# Patient Record
Sex: Male | Born: 1940 | Race: Black or African American | Marital: Married
Health system: Southern US, Community
[De-identification: ages and names within clinical notes are randomized; demographics above are authoritative.]

---

## 2009-11-30 ENCOUNTER — Inpatient Hospital Stay: Payer: Self-pay | Admitting: Internal Medicine

## 2012-01-14 ENCOUNTER — Inpatient Hospital Stay: Payer: Self-pay | Admitting: Internal Medicine

## 2012-01-14 ENCOUNTER — Ambulatory Visit: Payer: Self-pay | Admitting: Urology

## 2012-01-14 LAB — CBC WITH DIFFERENTIAL/PLATELET
Basophil %: 0.2 %
Basophil %: 0.4 %
Eosinophil #: 0 10*3/uL (ref 0.0–0.7)
Eosinophil %: 1 %
HCT: 22.9 % — ABNORMAL LOW (ref 40.0–52.0)
HGB: 7.8 g/dL — ABNORMAL LOW (ref 13.0–18.0)
HGB: 8.4 g/dL — ABNORMAL LOW (ref 13.0–18.0)
Lymphocyte #: 0.6 10*3/uL — ABNORMAL LOW (ref 1.0–3.6)
Lymphocyte %: 11.9 %
MCH: 31.7 pg (ref 26.0–34.0)
MCH: 31.7 pg (ref 26.0–34.0)
MCHC: 34.1 g/dL (ref 32.0–36.0)
MCHC: 34.1 g/dL (ref 32.0–36.0)
MCV: 93 fL (ref 80–100)
MCV: 93 fL (ref 80–100)
Monocyte #: 0.6 x10 3/mm (ref 0.2–1.0)
Monocyte %: 11.3 %
Neutrophil #: 4.2 10*3/uL (ref 1.4–6.5)
Neutrophil #: 4.6 10*3/uL (ref 1.4–6.5)
Neutrophil %: 75.4 %
Platelet: 204 10*3/uL (ref 150–440)
WBC: 5.8 10*3/uL (ref 3.8–10.6)

## 2012-01-14 LAB — URINALYSIS, COMPLETE
Ketone: NEGATIVE
Leukocyte Esterase: NEGATIVE
Nitrite: NEGATIVE
Ph: 5 (ref 4.5–8.0)
Protein: 100
Squamous Epithelial: NONE SEEN
WBC UR: NONE SEEN /HPF (ref 0–5)

## 2012-01-14 LAB — IRON AND TIBC
Iron Bind.Cap.(Total): 289 ug/dL (ref 250–450)
Iron: 60 ug/dL — ABNORMAL LOW (ref 65–175)

## 2012-01-14 LAB — COMPREHENSIVE METABOLIC PANEL
Albumin: 3.2 g/dL — ABNORMAL LOW (ref 3.4–5.0)
Anion Gap: 12 (ref 7–16)
BUN: 113 mg/dL — ABNORMAL HIGH (ref 7–18)
Bilirubin,Total: 0.3 mg/dL (ref 0.2–1.0)
Chloride: 113 mmol/L — ABNORMAL HIGH (ref 98–107)
Co2: 14 mmol/L — ABNORMAL LOW (ref 21–32)
Creatinine: 10.49 mg/dL — ABNORMAL HIGH (ref 0.60–1.30)
EGFR (African American): 5 — ABNORMAL LOW
EGFR (Non-African Amer.): 4 — ABNORMAL LOW
Osmolality: 312 (ref 275–301)
Potassium: 6.3 mmol/L — ABNORMAL HIGH (ref 3.5–5.1)
SGOT(AST): 18 U/L (ref 15–37)
Total Protein: 6.7 g/dL (ref 6.4–8.2)

## 2012-01-14 LAB — BASIC METABOLIC PANEL
Anion Gap: 13 (ref 7–16)
Anion Gap: 14 (ref 7–16)
BUN: 116 mg/dL — ABNORMAL HIGH (ref 7–18)
BUN: 91 mg/dL — ABNORMAL HIGH (ref 7–18)
Calcium, Total: 8.5 mg/dL (ref 8.5–10.1)
Chloride: 110 mmol/L — ABNORMAL HIGH (ref 98–107)
Chloride: 116 mmol/L — ABNORMAL HIGH (ref 98–107)
Co2: 18 mmol/L — ABNORMAL LOW (ref 21–32)
EGFR (African American): 6 — ABNORMAL LOW
EGFR (Non-African Amer.): 4 — ABNORMAL LOW
EGFR (Non-African Amer.): 5 — ABNORMAL LOW
Glucose: 121 mg/dL — ABNORMAL HIGH (ref 65–99)
Glucose: 62 mg/dL — ABNORMAL LOW (ref 65–99)
Osmolality: 312 (ref 275–301)
Potassium: 4.4 mmol/L (ref 3.5–5.1)
Sodium: 142 mmol/L (ref 136–145)
Sodium: 142 mmol/L (ref 136–145)

## 2012-01-14 LAB — PROTIME-INR: INR: 1.2

## 2012-01-14 LAB — SODIUM, URINE, RANDOM: Sodium, Urine Random: 55 mmol/L (ref 20–110)

## 2012-01-14 LAB — PRO B NATRIURETIC PEPTIDE: B-Type Natriuretic Peptide: 1498 pg/mL — ABNORMAL HIGH (ref 0–125)

## 2012-01-14 LAB — FERRITIN: Ferritin (ARMC): 289 ng/mL (ref 8–388)

## 2012-01-14 LAB — PHOSPHORUS: Phosphorus: 4.4 mg/dL (ref 2.5–4.9)

## 2012-01-14 LAB — RETICULOCYTES
Absolute Retic Count: 0.0223 10*6/uL — ABNORMAL LOW (ref 0.031–0.129)
Reticulocyte: 0.85 % (ref 0.7–2.5)

## 2012-01-14 LAB — MAGNESIUM: Magnesium: 2.6 mg/dL — ABNORMAL HIGH

## 2012-01-15 LAB — CBC WITH DIFFERENTIAL/PLATELET
Basophil #: 0 10*3/uL (ref 0.0–0.1)
Basophil %: 0.2 %
Eosinophil #: 0.1 10*3/uL (ref 0.0–0.7)
HCT: 20.8 % — ABNORMAL LOW (ref 40.0–52.0)
HGB: 7.3 g/dL — ABNORMAL LOW (ref 13.0–18.0)
Lymphocyte #: 0.4 10*3/uL — ABNORMAL LOW (ref 1.0–3.6)
Lymphocyte %: 5.7 %
MCH: 32.4 pg (ref 26.0–34.0)
MCHC: 35 g/dL (ref 32.0–36.0)
MCV: 93 fL (ref 80–100)
Monocyte %: 13.2 %
Neutrophil #: 5.4 10*3/uL (ref 1.4–6.5)
Platelet: 161 10*3/uL (ref 150–440)
RDW: 12.8 % (ref 11.5–14.5)

## 2012-01-15 LAB — BASIC METABOLIC PANEL
Anion Gap: 11 (ref 7–16)
Calcium, Total: 7.6 mg/dL — ABNORMAL LOW (ref 8.5–10.1)
Chloride: 108 mmol/L — ABNORMAL HIGH (ref 98–107)
Creatinine: 10.3 mg/dL — ABNORMAL HIGH (ref 0.60–1.30)
EGFR (African American): 5 — ABNORMAL LOW
EGFR (Non-African Amer.): 4 — ABNORMAL LOW
Glucose: 142 mg/dL — ABNORMAL HIGH (ref 65–99)
Osmolality: 308 (ref 275–301)
Potassium: 4.1 mmol/L (ref 3.5–5.1)
Sodium: 139 mmol/L (ref 136–145)

## 2012-01-15 LAB — URINE CULTURE

## 2012-01-15 LAB — PHOSPHORUS: Phosphorus: 7.4 mg/dL — ABNORMAL HIGH (ref 2.5–4.9)

## 2012-01-16 LAB — BASIC METABOLIC PANEL
Anion Gap: 12 (ref 7–16)
Calcium, Total: 7.4 mg/dL — ABNORMAL LOW (ref 8.5–10.1)
Chloride: 102 mmol/L (ref 98–107)
Co2: 23 mmol/L (ref 21–32)
Creatinine: 9.58 mg/dL — ABNORMAL HIGH (ref 0.60–1.30)
EGFR (African American): 6 — ABNORMAL LOW
Potassium: 3.9 mmol/L (ref 3.5–5.1)
Sodium: 137 mmol/L (ref 136–145)

## 2012-01-16 LAB — CBC WITH DIFFERENTIAL/PLATELET
Basophil %: 0.3 %
Eosinophil #: 0 10*3/uL (ref 0.0–0.7)
Eosinophil %: 0.8 %
HCT: 21.1 % — ABNORMAL LOW (ref 40.0–52.0)
HGB: 7.4 g/dL — ABNORMAL LOW (ref 13.0–18.0)
Lymphocyte #: 0.8 10*3/uL — ABNORMAL LOW (ref 1.0–3.6)
Lymphocyte %: 13 %
MCH: 32.3 pg (ref 26.0–34.0)
MCHC: 35.1 g/dL (ref 32.0–36.0)
MCV: 92 fL (ref 80–100)
Monocyte #: 0.9 x10 3/mm (ref 0.2–1.0)
Neutrophil #: 4.1 10*3/uL (ref 1.4–6.5)
RBC: 2.3 10*6/uL — ABNORMAL LOW (ref 4.40–5.90)

## 2012-01-16 LAB — PHOSPHORUS: Phosphorus: 6.9 mg/dL — ABNORMAL HIGH (ref 2.5–4.9)

## 2012-01-17 LAB — PSA: PSA: 1.7 ng/mL (ref 0.0–4.0)

## 2012-01-17 LAB — APTT: Activated PTT: 33.6 secs (ref 23.6–35.9)

## 2012-01-18 LAB — CBC WITH DIFFERENTIAL/PLATELET
Basophil #: 0 10*3/uL (ref 0.0–0.1)
Basophil %: 0.3 %
Eosinophil #: 0 10*3/uL (ref 0.0–0.7)
Eosinophil %: 0.4 %
Lymphocyte #: 0.6 10*3/uL — ABNORMAL LOW (ref 1.0–3.6)
MCH: 32 pg (ref 26.0–34.0)
MCHC: 34.7 g/dL (ref 32.0–36.0)
Monocyte #: 0.9 x10 3/mm (ref 0.2–1.0)
Neutrophil #: 5.8 10*3/uL (ref 1.4–6.5)
RDW: 12.3 % (ref 11.5–14.5)

## 2012-01-18 LAB — BASIC METABOLIC PANEL
BUN: 41 mg/dL — ABNORMAL HIGH (ref 7–18)
Calcium, Total: 7.8 mg/dL — ABNORMAL LOW (ref 8.5–10.1)
Chloride: 106 mmol/L (ref 98–107)
Creatinine: 3.62 mg/dL — ABNORMAL HIGH (ref 0.60–1.30)
EGFR (Non-African Amer.): 16 — ABNORMAL LOW
Osmolality: 295 (ref 275–301)
Potassium: 3.7 mmol/L (ref 3.5–5.1)
Sodium: 142 mmol/L (ref 136–145)

## 2012-01-19 LAB — CBC WITH DIFFERENTIAL/PLATELET
Basophil #: 0 10*3/uL (ref 0.0–0.1)
Basophil %: 0.5 %
Eosinophil #: 0.1 10*3/uL (ref 0.0–0.7)
HCT: 20.4 % — ABNORMAL LOW (ref 40.0–52.0)
HGB: 6.9 g/dL — ABNORMAL LOW (ref 13.0–18.0)
Lymphocyte %: 14.4 %
MCHC: 34 g/dL (ref 32.0–36.0)
Monocyte #: 0.7 x10 3/mm (ref 0.2–1.0)
Neutrophil #: 3.8 10*3/uL (ref 1.4–6.5)
Neutrophil %: 70.5 %
Platelet: 169 10*3/uL (ref 150–440)
RBC: 2.19 10*6/uL — ABNORMAL LOW (ref 4.40–5.90)
RDW: 12.3 % (ref 11.5–14.5)
WBC: 5.4 10*3/uL (ref 3.8–10.6)

## 2012-01-19 LAB — BASIC METABOLIC PANEL
Anion Gap: 9 (ref 7–16)
BUN: 33 mg/dL — ABNORMAL HIGH (ref 7–18)
Calcium, Total: 7.8 mg/dL — ABNORMAL LOW (ref 8.5–10.1)
Chloride: 108 mmol/L — ABNORMAL HIGH (ref 98–107)
Co2: 28 mmol/L (ref 21–32)
EGFR (Non-African Amer.): 25 — ABNORMAL LOW
Osmolality: 298 (ref 275–301)
Potassium: 3.6 mmol/L (ref 3.5–5.1)

## 2012-01-20 ENCOUNTER — Ambulatory Visit: Payer: Self-pay | Admitting: Internal Medicine

## 2012-01-20 LAB — CBC WITH DIFFERENTIAL/PLATELET
Eosinophil %: 2.2 %
MCH: 32.3 pg (ref 26.0–34.0)
MCHC: 34.4 g/dL (ref 32.0–36.0)
MCV: 94 fL (ref 80–100)
Monocyte #: 0.7 x10 3/mm (ref 0.2–1.0)
Monocyte %: 10.1 %
Neutrophil %: 76.6 %
Platelet: 185 10*3/uL (ref 150–440)
RBC: 2.18 10*6/uL — ABNORMAL LOW (ref 4.40–5.90)
RDW: 12.3 % (ref 11.5–14.5)

## 2012-01-20 LAB — BASIC METABOLIC PANEL WITH GFR
Anion Gap: 7
BUN: 27 mg/dL — ABNORMAL HIGH
Calcium, Total: 7.7 mg/dL — ABNORMAL LOW
Chloride: 112 mmol/L — ABNORMAL HIGH
Co2: 27 mmol/L
Creatinine: 2.24 mg/dL — ABNORMAL HIGH
EGFR (African American): 33 — ABNORMAL LOW
EGFR (Non-African Amer.): 28 — ABNORMAL LOW
Glucose: 126 mg/dL — ABNORMAL HIGH
Osmolality: 297
Potassium: 3.9 mmol/L
Sodium: 146 mmol/L — ABNORMAL HIGH

## 2012-01-21 LAB — BASIC METABOLIC PANEL
BUN: 28 mg/dL — ABNORMAL HIGH (ref 7–18)
Calcium, Total: 7.8 mg/dL — ABNORMAL LOW (ref 8.5–10.1)
EGFR (African American): 37 — ABNORMAL LOW
EGFR (Non-African Amer.): 32 — ABNORMAL LOW
Glucose: 146 mg/dL — ABNORMAL HIGH (ref 65–99)
Osmolality: 295 (ref 275–301)
Potassium: 3.7 mmol/L (ref 3.5–5.1)
Sodium: 144 mmol/L (ref 136–145)

## 2012-01-22 LAB — CULTURE, BLOOD (SINGLE)

## 2012-01-23 ENCOUNTER — Ambulatory Visit: Payer: Self-pay | Admitting: Internal Medicine

## 2012-01-23 LAB — CBC WITH DIFFERENTIAL/PLATELET
Basophil %: 0.6 %
Eosinophil %: 2.3 %
Lymphocyte #: 0.9 10*3/uL — ABNORMAL LOW (ref 1.0–3.6)
Monocyte #: 0.5 x10 3/mm (ref 0.2–1.0)
Monocyte %: 9.7 %
Neutrophil %: 71.2 %
Platelet: 245 10*3/uL (ref 150–440)
RBC: 2.23 10*6/uL — ABNORMAL LOW (ref 4.40–5.90)
WBC: 5.5 10*3/uL (ref 3.8–10.6)

## 2012-01-25 LAB — PATHOLOGY REPORT

## 2012-02-02 ENCOUNTER — Ambulatory Visit: Payer: Self-pay | Admitting: Internal Medicine

## 2012-02-06 LAB — CBC CANCER CENTER
Basophil #: 0 x10 3/mm (ref 0.0–0.1)
Eosinophil #: 0.1 x10 3/mm (ref 0.0–0.7)
HCT: 24.8 % — ABNORMAL LOW (ref 40.0–52.0)
Lymphocyte #: 0.9 x10 3/mm — ABNORMAL LOW (ref 1.0–3.6)
MCH: 30.6 pg (ref 26.0–34.0)
MCHC: 32.5 g/dL (ref 32.0–36.0)
MCV: 94 fL (ref 80–100)
Monocyte #: 0.4 x10 3/mm (ref 0.2–1.0)
Monocyte %: 7.9 %
Neutrophil #: 3.9 x10 3/mm (ref 1.4–6.5)
Platelet: 248 x10 3/mm (ref 150–440)
RDW: 13.2 % (ref 11.5–14.5)

## 2012-02-06 LAB — BASIC METABOLIC PANEL
BUN: 45 mg/dL — ABNORMAL HIGH (ref 7–18)
Chloride: 103 mmol/L (ref 98–107)
Co2: 21 mmol/L (ref 21–32)
Creatinine: 2.01 mg/dL — ABNORMAL HIGH (ref 0.60–1.30)
EGFR (Non-African Amer.): 32 — ABNORMAL LOW
Osmolality: 289 (ref 275–301)

## 2012-02-13 LAB — BASIC METABOLIC PANEL
Anion Gap: 11 (ref 7–16)
BUN: 51 mg/dL — ABNORMAL HIGH (ref 7–18)
Chloride: 104 mmol/L (ref 98–107)
Co2: 22 mmol/L (ref 21–32)
EGFR (African American): 32 — ABNORMAL LOW
Glucose: 154 mg/dL — ABNORMAL HIGH (ref 65–99)
Osmolality: 291 (ref 275–301)
Sodium: 137 mmol/L (ref 136–145)

## 2012-02-13 LAB — CBC CANCER CENTER
Basophil #: 0 x10 3/mm (ref 0.0–0.1)
HGB: 7.1 g/dL — ABNORMAL LOW (ref 13.0–18.0)
Lymphocyte #: 0.6 x10 3/mm — ABNORMAL LOW (ref 1.0–3.6)
MCV: 94 fL (ref 80–100)
Monocyte #: 0.1 x10 3/mm — ABNORMAL LOW (ref 0.2–1.0)
Monocyte %: 5.5 %
Platelet: 123 x10 3/mm — ABNORMAL LOW (ref 150–440)
RDW: 12.8 % (ref 11.5–14.5)

## 2012-02-20 LAB — CBC CANCER CENTER
Basophil #: 0 x10 3/mm (ref 0.0–0.1)
Basophil %: 0.5 %
Eosinophil #: 0 x10 3/mm (ref 0.0–0.7)
HCT: 23.1 % — ABNORMAL LOW (ref 40.0–52.0)
Lymphocyte %: 44.2 %
MCHC: 33.1 g/dL (ref 32.0–36.0)
Monocyte %: 3.4 %
Neutrophil %: 51.3 %
RBC: 2.49 10*6/uL — ABNORMAL LOW (ref 4.40–5.90)
RDW: 12.3 % (ref 11.5–14.5)
WBC: 0.9 x10 3/mm — CL (ref 3.8–10.6)

## 2012-02-20 LAB — CREATININE, SERUM: EGFR (African American): 35 — ABNORMAL LOW

## 2012-02-21 ENCOUNTER — Inpatient Hospital Stay: Payer: Self-pay | Admitting: Student

## 2012-02-21 LAB — URINALYSIS, COMPLETE
Bilirubin,UR: NEGATIVE
Glucose,UR: NEGATIVE mg/dL (ref 0–75)
Glucose,UR: NEGATIVE mg/dL (ref 0–75)
Ketone: NEGATIVE
Nitrite: NEGATIVE
Nitrite: NEGATIVE
Ph: 5 (ref 4.5–8.0)
Ph: 5 (ref 4.5–8.0)
Protein: 100
RBC,UR: 1782 /HPF (ref 0–5)
RBC,UR: 60 /HPF (ref 0–5)
Specific Gravity: 1.013 (ref 1.003–1.030)
Squamous Epithelial: NONE SEEN
Squamous Epithelial: NONE SEEN
WBC UR: 13 /HPF (ref 0–5)

## 2012-02-21 LAB — CBC WITH DIFFERENTIAL/PLATELET
Basophil #: 0 10*3/uL (ref 0.0–0.1)
Eosinophil #: 0 10*3/uL (ref 0.0–0.7)
HGB: 7.1 g/dL — ABNORMAL LOW (ref 13.0–18.0)
Lymphocyte %: 46.3 %
MCHC: 35.1 g/dL (ref 32.0–36.0)
Neutrophil %: 47.5 %
Platelet: 30 10*3/uL — CL (ref 150–440)
RDW: 12.6 % (ref 11.5–14.5)

## 2012-02-21 LAB — COMPREHENSIVE METABOLIC PANEL
Albumin: 2.9 g/dL — ABNORMAL LOW (ref 3.4–5.0)
Alkaline Phosphatase: 66 U/L (ref 50–136)
Anion Gap: 8 (ref 7–16)
BUN: 47 mg/dL — ABNORMAL HIGH (ref 7–18)
Calcium, Total: 8.2 mg/dL — ABNORMAL LOW (ref 8.5–10.1)
EGFR (African American): 36 — ABNORMAL LOW
Glucose: 124 mg/dL — ABNORMAL HIGH (ref 65–99)
Potassium: 4.7 mmol/L (ref 3.5–5.1)
SGOT(AST): 13 U/L — ABNORMAL LOW (ref 15–37)
Sodium: 138 mmol/L (ref 136–145)

## 2012-02-22 LAB — CBC WITH DIFFERENTIAL/PLATELET
Basophil %: 0.5 %
Eosinophil #: 0 10*3/uL (ref 0.0–0.7)
Eosinophil %: 1 %
HCT: 18.5 % — ABNORMAL LOW (ref 40.0–52.0)
HGB: 6.3 g/dL — ABNORMAL LOW (ref 13.0–18.0)
Lymphocyte #: 0.5 10*3/uL — ABNORMAL LOW (ref 1.0–3.6)
Lymphocyte %: 25.9 %
MCHC: 34.3 g/dL (ref 32.0–36.0)
Monocyte %: 5.6 %
Neutrophil #: 1.2 10*3/uL — ABNORMAL LOW (ref 1.4–6.5)
Neutrophil %: 67 %
RBC: 2.04 10*6/uL — ABNORMAL LOW (ref 4.40–5.90)

## 2012-02-22 LAB — CREATININE, SERUM
Creatinine: 1.87 mg/dL — ABNORMAL HIGH (ref 0.60–1.30)
EGFR (African American): 40 — ABNORMAL LOW
EGFR (Non-African Amer.): 35 — ABNORMAL LOW

## 2012-02-23 LAB — CREATININE, SERUM
Creatinine: 1.93 mg/dL — ABNORMAL HIGH (ref 0.60–1.30)
EGFR (African American): 39 — ABNORMAL LOW

## 2012-02-23 LAB — CULTURE, BLOOD (SINGLE)

## 2012-02-25 LAB — CBC WITH DIFFERENTIAL/PLATELET
Bands: 6 %
Basophil #: 0 10*3/uL (ref 0.0–0.1)
Comment - H1-Com1: NORMAL
Eosinophil #: 0.1 10*3/uL (ref 0.0–0.7)
HCT: 24.3 % — ABNORMAL LOW (ref 40.0–52.0)
HGB: 8.3 g/dL — ABNORMAL LOW (ref 13.0–18.0)
HGB: 7.6 g/dL — ABNORMAL LOW (ref 13.0–18.0)
Lymphocyte #: 0.6 10*3/uL — ABNORMAL LOW (ref 1.0–3.6)
MCH: 31.1 pg (ref 26.0–34.0)
MCH: 31.4 pg (ref 26.0–34.0)
MCHC: 34.9 g/dL (ref 32.0–36.0)
MCV: 91 fL (ref 80–100)
Monocyte #: 0.3 x10 3/mm (ref 0.2–1.0)
Myelocyte: 3 %
Neutrophil #: 1.2 10*3/uL — ABNORMAL LOW (ref 1.4–6.5)
Platelet: 146 10*3/uL — ABNORMAL LOW (ref 150–440)
Promyelocyte: 1 %
RBC: 2.42 10*6/uL — ABNORMAL LOW (ref 4.40–5.90)
RDW: 12.3 % (ref 11.5–14.5)
RDW: 12.5 % (ref 11.5–14.5)
Segmented Neutrophils: 37 %
WBC: 2.2 10*3/uL — ABNORMAL LOW (ref 3.8–10.6)
WBC: 9.8 10*3/uL (ref 3.8–10.6)

## 2012-02-25 LAB — CREATININE, SERUM
Creatinine: 1.88 mg/dL — ABNORMAL HIGH (ref 0.60–1.30)
EGFR (African American): 40 — ABNORMAL LOW

## 2012-02-27 LAB — CBC CANCER CENTER
Basophil #: 0.3 x10 3/mm — ABNORMAL HIGH (ref 0.0–0.1)
Eosinophil #: 0 x10 3/mm (ref 0.0–0.7)
Eosinophil %: 0.2 %
HGB: 7.9 g/dL — ABNORMAL LOW (ref 13.0–18.0)
Lymphocyte #: 1.4 x10 3/mm (ref 1.0–3.6)
Lymphocyte %: 5.1 %
Monocyte %: 4.1 %
Platelet: 258 x10 3/mm (ref 150–440)
RDW: 12.6 % (ref 11.5–14.5)

## 2012-02-27 LAB — BASIC METABOLIC PANEL
BUN: 34 mg/dL — ABNORMAL HIGH (ref 7–18)
Calcium, Total: 8.4 mg/dL — ABNORMAL LOW (ref 8.5–10.1)
Chloride: 105 mmol/L (ref 98–107)
Osmolality: 293 (ref 275–301)
Potassium: 5 mmol/L (ref 3.5–5.1)
Sodium: 141 mmol/L (ref 136–145)

## 2012-02-27 LAB — HEPATIC FUNCTION PANEL A (ARMC)
Alkaline Phosphatase: 95 U/L (ref 50–136)
Bilirubin, Direct: 0.1 mg/dL (ref 0.00–0.20)
Bilirubin,Total: 0.1 mg/dL — ABNORMAL LOW (ref 0.2–1.0)
SGPT (ALT): 36 U/L (ref 12–78)
Total Protein: 6.7 g/dL (ref 6.4–8.2)

## 2012-02-28 ENCOUNTER — Emergency Department: Payer: Self-pay

## 2012-02-29 LAB — CULTURE, BLOOD (SINGLE)

## 2012-03-03 ENCOUNTER — Ambulatory Visit: Payer: Self-pay | Admitting: Internal Medicine

## 2012-03-05 LAB — BASIC METABOLIC PANEL
Calcium, Total: 8.4 mg/dL — ABNORMAL LOW (ref 8.5–10.1)
Chloride: 108 mmol/L — ABNORMAL HIGH (ref 98–107)
Co2: 24 mmol/L (ref 21–32)
Creatinine: 1.97 mg/dL — ABNORMAL HIGH (ref 0.60–1.30)
EGFR (African American): 38 — ABNORMAL LOW
Osmolality: 297 (ref 275–301)
Sodium: 143 mmol/L (ref 136–145)

## 2012-03-05 LAB — CBC CANCER CENTER
Eosinophil %: 0.5 %
HCT: 23 % — ABNORMAL LOW (ref 40.0–52.0)
Lymphocyte #: 1 x10 3/mm (ref 1.0–3.6)
MCH: 31.2 pg (ref 26.0–34.0)
MCV: 91 fL (ref 80–100)
Monocyte #: 0.7 x10 3/mm (ref 0.2–1.0)
Neutrophil #: 5.6 x10 3/mm (ref 1.4–6.5)
Neutrophil %: 75.7 %
Platelet: 203 x10 3/mm (ref 150–440)
RBC: 2.54 10*6/uL — ABNORMAL LOW (ref 4.40–5.90)
RDW: 12.9 % (ref 11.5–14.5)

## 2012-03-08 ENCOUNTER — Ambulatory Visit: Payer: Self-pay | Admitting: Urology

## 2012-03-12 LAB — CBC CANCER CENTER
Basophil #: 0.1 x10 3/mm (ref 0.0–0.1)
Basophil %: 0.6 %
Eosinophil #: 0 x10 3/mm (ref 0.0–0.7)
Eosinophil %: 0 %
HGB: 7.6 g/dL — ABNORMAL LOW (ref 13.0–18.0)
Lymphocyte #: 1.2 x10 3/mm (ref 1.0–3.6)
MCH: 31.6 pg (ref 26.0–34.0)
MCHC: 34.6 g/dL (ref 32.0–36.0)
MCV: 91 fL (ref 80–100)
Monocyte #: 0.4 x10 3/mm (ref 0.2–1.0)
Neutrophil %: 88.5 %
RBC: 2.41 10*6/uL — ABNORMAL LOW (ref 4.40–5.90)
RDW: 13.6 % (ref 11.5–14.5)

## 2012-03-12 LAB — BASIC METABOLIC PANEL
Calcium, Total: 8.3 mg/dL — ABNORMAL LOW (ref 8.5–10.1)
Chloride: 107 mmol/L (ref 98–107)
Co2: 24 mmol/L (ref 21–32)
EGFR (Non-African Amer.): 30 — ABNORMAL LOW
Glucose: 105 mg/dL — ABNORMAL HIGH (ref 65–99)
Potassium: 4.8 mmol/L (ref 3.5–5.1)
Sodium: 141 mmol/L (ref 136–145)

## 2012-03-19 LAB — CBC CANCER CENTER
Basophil %: 0.4 %
Eosinophil %: 0.4 %
HGB: 6.8 g/dL — ABNORMAL LOW (ref 13.0–18.0)
Lymphocyte #: 1 x10 3/mm (ref 1.0–3.6)
MCH: 31.3 pg (ref 26.0–34.0)
MCV: 91 fL (ref 80–100)
Monocyte #: 0.3 x10 3/mm (ref 0.2–1.0)
Monocyte %: 3.8 %
Neutrophil %: 81.5 %
RBC: 2.16 10*6/uL — ABNORMAL LOW (ref 4.40–5.90)
WBC: 7.5 x10 3/mm (ref 3.8–10.6)

## 2012-03-19 LAB — BASIC METABOLIC PANEL
Anion Gap: 12 (ref 7–16)
Calcium, Total: 8.3 mg/dL — ABNORMAL LOW (ref 8.5–10.1)
Co2: 22 mmol/L (ref 21–32)
Creatinine: 2.32 mg/dL — ABNORMAL HIGH (ref 0.60–1.30)
EGFR (Non-African Amer.): 27 — ABNORMAL LOW
Glucose: 181 mg/dL — ABNORMAL HIGH (ref 65–99)
Potassium: 4.8 mmol/L (ref 3.5–5.1)
Sodium: 142 mmol/L (ref 136–145)

## 2012-04-02 LAB — CBC CANCER CENTER
Basophil #: 0 x10 3/mm (ref 0.0–0.1)
Eosinophil #: 0.1 x10 3/mm (ref 0.0–0.7)
Eosinophil %: 2.4 %
Lymphocyte #: 0.9 x10 3/mm — ABNORMAL LOW (ref 1.0–3.6)
MCH: 31.2 pg (ref 26.0–34.0)
MCHC: 33.5 g/dL (ref 32.0–36.0)
MCV: 93 fL (ref 80–100)
Monocyte #: 0.6 x10 3/mm (ref 0.2–1.0)
Monocyte %: 12.3 %
Neutrophil %: 66.8 %
Platelet: 433 x10 3/mm (ref 150–440)
RBC: 2.42 10*6/uL — ABNORMAL LOW (ref 4.40–5.90)
RDW: 16.4 % — ABNORMAL HIGH (ref 11.5–14.5)
WBC: 5.2 x10 3/mm (ref 3.8–10.6)

## 2012-04-02 LAB — BASIC METABOLIC PANEL
BUN: 41 mg/dL — ABNORMAL HIGH (ref 7–18)
Calcium, Total: 8.3 mg/dL — ABNORMAL LOW (ref 8.5–10.1)
Co2: 22 mmol/L (ref 21–32)
EGFR (African American): 31 — ABNORMAL LOW
EGFR (Non-African Amer.): 26 — ABNORMAL LOW
Glucose: 138 mg/dL — ABNORMAL HIGH (ref 65–99)
Osmolality: 290 (ref 275–301)
Sodium: 139 mmol/L (ref 136–145)

## 2012-04-03 ENCOUNTER — Ambulatory Visit: Payer: Self-pay | Admitting: Internal Medicine

## 2012-04-04 LAB — BASIC METABOLIC PANEL
Anion Gap: 10 (ref 7–16)
Calcium, Total: 8.2 mg/dL — ABNORMAL LOW (ref 8.5–10.1)
EGFR (African American): 32 — ABNORMAL LOW
EGFR (Non-African Amer.): 27 — ABNORMAL LOW
Potassium: 5.1 mmol/L (ref 3.5–5.1)
Sodium: 137 mmol/L (ref 136–145)

## 2012-04-09 LAB — CBC CANCER CENTER
Basophil #: 0 x10 3/mm (ref 0.0–0.1)
Basophil %: 0.6 %
Eosinophil #: 0 x10 3/mm (ref 0.0–0.7)
Eosinophil %: 0.3 %
HCT: 23.1 % — ABNORMAL LOW (ref 40.0–52.0)
Lymphocyte #: 0.9 x10 3/mm — ABNORMAL LOW (ref 1.0–3.6)
Lymphocyte %: 12.1 %
MCH: 31.8 pg (ref 26.0–34.0)
Neutrophil #: 6.1 x10 3/mm (ref 1.4–6.5)
Neutrophil %: 78.4 %
Platelet: 196 x10 3/mm (ref 150–440)
WBC: 7.8 x10 3/mm (ref 3.8–10.6)

## 2012-04-09 LAB — BASIC METABOLIC PANEL
BUN: 39 mg/dL — ABNORMAL HIGH (ref 7–18)
Calcium, Total: 8.3 mg/dL — ABNORMAL LOW (ref 8.5–10.1)
Chloride: 105 mmol/L (ref 98–107)
Creatinine: 2.02 mg/dL — ABNORMAL HIGH (ref 0.60–1.30)
EGFR (African American): 37 — ABNORMAL LOW
EGFR (Non-African Amer.): 32 — ABNORMAL LOW
Glucose: 173 mg/dL — ABNORMAL HIGH (ref 65–99)
Osmolality: 289 (ref 275–301)
Potassium: 5 mmol/L (ref 3.5–5.1)
Sodium: 138 mmol/L (ref 136–145)

## 2012-04-16 LAB — CBC CANCER CENTER
Basophil #: 0.1 x10 3/mm (ref 0.0–0.1)
Basophil %: 0.3 %
MCV: 94 fL (ref 80–100)
Monocyte #: 1.9 x10 3/mm — ABNORMAL HIGH (ref 0.2–1.0)
Monocyte %: 5.7 %
Neutrophil #: 30.2 x10 3/mm — ABNORMAL HIGH (ref 1.4–6.5)
Neutrophil %: 90.1 %
RBC: 2.1 10*6/uL — ABNORMAL LOW (ref 4.40–5.90)
RDW: 16.2 % — ABNORMAL HIGH (ref 11.5–14.5)

## 2012-04-16 LAB — IRON AND TIBC
Iron Saturation: 76 %
Iron: 193 ug/dL — ABNORMAL HIGH (ref 65–175)

## 2012-04-16 LAB — HEPATIC FUNCTION PANEL A (ARMC)
Alkaline Phosphatase: 153 U/L — ABNORMAL HIGH (ref 50–136)
SGOT(AST): 17 U/L (ref 15–37)

## 2012-04-16 LAB — BASIC METABOLIC PANEL
Anion Gap: 8 (ref 7–16)
Calcium, Total: 8.4 mg/dL — ABNORMAL LOW (ref 8.5–10.1)
Chloride: 107 mmol/L (ref 98–107)
Co2: 24 mmol/L (ref 21–32)
Creatinine: 2.24 mg/dL — ABNORMAL HIGH (ref 0.60–1.30)
EGFR (African American): 32 — ABNORMAL LOW
EGFR (Non-African Amer.): 28 — ABNORMAL LOW
Glucose: 119 mg/dL — ABNORMAL HIGH (ref 65–99)
Osmolality: 292 (ref 275–301)

## 2012-04-16 LAB — FERRITIN: Ferritin (ARMC): 1172 ng/mL — ABNORMAL HIGH (ref 8–388)

## 2012-04-16 LAB — FOLATE: Folic Acid: 31 ng/mL (ref 3.1–100.0)

## 2012-04-23 LAB — CBC CANCER CENTER
Basophil #: 0 x10 3/mm (ref 0.0–0.1)
Basophil %: 0.2 %
Eosinophil #: 0.1 x10 3/mm (ref 0.0–0.7)
HCT: 23.5 % — ABNORMAL LOW (ref 40.0–52.0)
HGB: 8.1 g/dL — ABNORMAL LOW (ref 13.0–18.0)
MCH: 31 pg (ref 26.0–34.0)
MCHC: 34.3 g/dL (ref 32.0–36.0)
MCV: 90 fL (ref 80–100)
Monocyte #: 1.4 x10 3/mm — ABNORMAL HIGH (ref 0.2–1.0)
Neutrophil #: 21.9 x10 3/mm — ABNORMAL HIGH (ref 1.4–6.5)
Platelet: 203 x10 3/mm (ref 150–440)
RBC: 2.6 10*6/uL — ABNORMAL LOW (ref 4.40–5.90)
RDW: 16.8 % — ABNORMAL HIGH (ref 11.5–14.5)
WBC: 24.7 x10 3/mm — ABNORMAL HIGH (ref 3.8–10.6)

## 2012-05-04 ENCOUNTER — Ambulatory Visit: Payer: Self-pay | Admitting: Internal Medicine

## 2012-05-07 LAB — CBC CANCER CENTER
Basophil #: 0 x10 3/mm (ref 0.0–0.1)
Basophil %: 0.2 %
Eosinophil #: 0 x10 3/mm (ref 0.0–0.7)
Eosinophil %: 0.3 %
HCT: 24 % — ABNORMAL LOW (ref 40.0–52.0)
HGB: 8.3 g/dL — ABNORMAL LOW (ref 13.0–18.0)
Lymphocyte %: 8.1 %
MCV: 93 fL (ref 80–100)
Monocyte #: 1 x10 3/mm (ref 0.2–1.0)
Monocyte %: 10.5 %
Neutrophil %: 80.9 %
Platelet: 179 x10 3/mm (ref 150–440)
RDW: 19.6 % — ABNORMAL HIGH (ref 11.5–14.5)

## 2012-05-20 ENCOUNTER — Ambulatory Visit: Payer: Self-pay | Admitting: Urology

## 2012-05-21 LAB — BASIC METABOLIC PANEL
Anion Gap: 8 (ref 7–16)
BUN: 56 mg/dL — ABNORMAL HIGH (ref 7–18)
Calcium, Total: 8.4 mg/dL — ABNORMAL LOW (ref 8.5–10.1)
Co2: 25 mmol/L (ref 21–32)
EGFR (African American): 29 — ABNORMAL LOW
Glucose: 159 mg/dL — ABNORMAL HIGH (ref 65–99)
Sodium: 138 mmol/L (ref 136–145)

## 2012-05-21 LAB — CBC CANCER CENTER
Basophil %: 0.7 %
HCT: 23.5 % — ABNORMAL LOW (ref 40.0–52.0)
Lymphocyte %: 16.6 %
MCHC: 34.1 g/dL (ref 32.0–36.0)
MCV: 94 fL (ref 80–100)
Monocyte #: 0.4 x10 3/mm (ref 0.2–1.0)
Neutrophil #: 4 x10 3/mm (ref 1.4–6.5)
Neutrophil %: 72.7 %
Platelet: 208 x10 3/mm (ref 150–440)
RBC: 2.51 10*6/uL — ABNORMAL LOW (ref 4.40–5.90)

## 2012-05-23 LAB — POTASSIUM: Potassium: 4.6 mmol/L (ref 3.5–5.1)

## 2012-05-27 ENCOUNTER — Ambulatory Visit: Payer: Self-pay | Admitting: Urology

## 2012-05-29 LAB — BASIC METABOLIC PANEL
Anion Gap: 8 (ref 7–16)
BUN: 45 mg/dL — ABNORMAL HIGH (ref 7–18)
Chloride: 106 mmol/L (ref 98–107)
EGFR (African American): 29 — ABNORMAL LOW
EGFR (Non-African Amer.): 25 — ABNORMAL LOW
Glucose: 138 mg/dL — ABNORMAL HIGH (ref 65–99)
Sodium: 139 mmol/L (ref 136–145)

## 2012-05-29 LAB — CBC CANCER CENTER
Eosinophil %: 1.8 %
HCT: 27.4 % — ABNORMAL LOW (ref 40.0–52.0)
HGB: 9.3 g/dL — ABNORMAL LOW (ref 13.0–18.0)
Lymphocyte #: 0.7 x10 3/mm — ABNORMAL LOW (ref 1.0–3.6)
Lymphocyte %: 12.7 %
MCHC: 33.9 g/dL (ref 32.0–36.0)
Monocyte #: 0.4 x10 3/mm (ref 0.2–1.0)
Monocyte %: 6.5 %
Neutrophil #: 4.5 x10 3/mm (ref 1.4–6.5)
Neutrophil %: 78.5 %
Platelet: 199 x10 3/mm (ref 150–440)
RDW: 19.6 % — ABNORMAL HIGH (ref 11.5–14.5)

## 2012-06-01 ENCOUNTER — Ambulatory Visit: Payer: Self-pay | Admitting: Internal Medicine

## 2012-06-05 LAB — BASIC METABOLIC PANEL
Anion Gap: 10 (ref 7–16)
Calcium, Total: 8.1 mg/dL — ABNORMAL LOW (ref 8.5–10.1)
Chloride: 106 mmol/L (ref 98–107)
Co2: 23 mmol/L (ref 21–32)
Creatinine: 2.56 mg/dL — ABNORMAL HIGH (ref 0.60–1.30)
EGFR (African American): 28 — ABNORMAL LOW
EGFR (Non-African Amer.): 24 — ABNORMAL LOW
Glucose: 116 mg/dL — ABNORMAL HIGH (ref 65–99)
Osmolality: 293 (ref 275–301)
Potassium: 5.1 mmol/L (ref 3.5–5.1)
Sodium: 139 mmol/L (ref 136–145)

## 2012-06-05 LAB — CBC CANCER CENTER
Basophil #: 0 x10 3/mm (ref 0.0–0.1)
Basophil %: 0.6 %
Eosinophil %: 2.1 %
HCT: 28 % — ABNORMAL LOW (ref 40.0–52.0)
HGB: 9.4 g/dL — ABNORMAL LOW (ref 13.0–18.0)
Lymphocyte %: 15.1 %
MCH: 32.5 pg (ref 26.0–34.0)
MCV: 96 fL (ref 80–100)
Monocyte #: 0.4 x10 3/mm (ref 0.2–1.0)
Neutrophil #: 3.8 x10 3/mm (ref 1.4–6.5)
Neutrophil %: 75 %
Platelet: 157 x10 3/mm (ref 150–440)
RBC: 2.9 10*6/uL — ABNORMAL LOW (ref 4.40–5.90)

## 2012-06-11 ENCOUNTER — Ambulatory Visit: Payer: Self-pay | Admitting: Urology

## 2012-06-12 LAB — CBC CANCER CENTER
Basophil #: 0 x10 3/mm (ref 0.0–0.1)
Eosinophil #: 0.1 x10 3/mm (ref 0.0–0.7)
HCT: 26.3 % — ABNORMAL LOW (ref 40.0–52.0)
HGB: 8.7 g/dL — ABNORMAL LOW (ref 13.0–18.0)
Lymphocyte #: 0.5 x10 3/mm — ABNORMAL LOW (ref 1.0–3.6)
Lymphocyte %: 9.5 %
MCH: 32.3 pg (ref 26.0–34.0)
MCV: 98 fL (ref 80–100)
Monocyte #: 0.3 x10 3/mm (ref 0.2–1.0)
Neutrophil #: 4.7 x10 3/mm (ref 1.4–6.5)
Neutrophil %: 83.4 %
RBC: 2.7 10*6/uL — ABNORMAL LOW (ref 4.40–5.90)
RDW: 16 % — ABNORMAL HIGH (ref 11.5–14.5)
WBC: 5.6 x10 3/mm (ref 3.8–10.6)

## 2012-06-12 LAB — HEPATIC FUNCTION PANEL A (ARMC)
Albumin: 3.1 g/dL — ABNORMAL LOW (ref 3.4–5.0)
Total Protein: 6.9 g/dL (ref 6.4–8.2)

## 2012-06-12 LAB — BASIC METABOLIC PANEL
Anion Gap: 10 (ref 7–16)
BUN: 54 mg/dL — ABNORMAL HIGH (ref 7–18)
Calcium, Total: 8 mg/dL — ABNORMAL LOW (ref 8.5–10.1)
Co2: 24 mmol/L (ref 21–32)
Creatinine: 2.64 mg/dL — ABNORMAL HIGH (ref 0.60–1.30)
Glucose: 183 mg/dL — ABNORMAL HIGH (ref 65–99)
Osmolality: 301 (ref 275–301)
Potassium: 5 mmol/L (ref 3.5–5.1)
Sodium: 141 mmol/L (ref 136–145)

## 2012-06-19 LAB — CBC CANCER CENTER
Basophil %: 0.3 %
Eosinophil #: 0.2 x10 3/mm (ref 0.0–0.7)
Eosinophil %: 5.2 %
HGB: 8.8 g/dL — ABNORMAL LOW (ref 13.0–18.0)
Lymphocyte #: 0.7 x10 3/mm — ABNORMAL LOW (ref 1.0–3.6)
Lymphocyte %: 16.6 %
MCH: 32.9 pg (ref 26.0–34.0)
MCV: 96 fL (ref 80–100)
Monocyte %: 8.2 %
Neutrophil #: 2.8 x10 3/mm (ref 1.4–6.5)
RBC: 2.68 10*6/uL — ABNORMAL LOW (ref 4.40–5.90)
RDW: 15.4 % — ABNORMAL HIGH (ref 11.5–14.5)

## 2012-06-19 LAB — CREATININE, SERUM
Creatinine: 2.41 mg/dL — ABNORMAL HIGH (ref 0.60–1.30)
EGFR (African American): 30 — ABNORMAL LOW
EGFR (Non-African Amer.): 26 — ABNORMAL LOW

## 2012-06-26 LAB — CBC CANCER CENTER
Basophil #: 0 x10 3/mm (ref 0.0–0.1)
Eosinophil %: 4.7 %
HCT: 24.7 % — ABNORMAL LOW (ref 40.0–52.0)
HGB: 8.6 g/dL — ABNORMAL LOW (ref 13.0–18.0)
Lymphocyte #: 0.4 x10 3/mm — ABNORMAL LOW (ref 1.0–3.6)
Lymphocyte %: 12.3 %
MCHC: 34.8 g/dL (ref 32.0–36.0)
MCV: 96 fL (ref 80–100)
Monocyte %: 11.6 %
Neutrophil #: 2.5 x10 3/mm (ref 1.4–6.5)
Neutrophil %: 70.9 %
Platelet: 113 x10 3/mm — ABNORMAL LOW (ref 150–440)
RBC: 2.58 10*6/uL — ABNORMAL LOW (ref 4.40–5.90)
RDW: 14.7 % — ABNORMAL HIGH (ref 11.5–14.5)

## 2012-06-26 LAB — HEPATIC FUNCTION PANEL A (ARMC): SGOT(AST): 17 U/L (ref 15–37)

## 2012-06-26 LAB — BASIC METABOLIC PANEL
Anion Gap: 8 (ref 7–16)
BUN: 51 mg/dL — ABNORMAL HIGH (ref 7–18)
Chloride: 106 mmol/L (ref 98–107)
Co2: 25 mmol/L (ref 21–32)
EGFR (African American): 26 — ABNORMAL LOW
Glucose: 121 mg/dL — ABNORMAL HIGH (ref 65–99)
Osmolality: 292 (ref 275–301)
Sodium: 139 mmol/L (ref 136–145)

## 2012-07-02 ENCOUNTER — Ambulatory Visit: Payer: Self-pay | Admitting: Internal Medicine

## 2012-07-03 LAB — CBC CANCER CENTER
Basophil %: 0.5 %
Eosinophil %: 4.1 %
HCT: 24 % — ABNORMAL LOW (ref 40.0–52.0)
Lymphocyte %: 9.2 %
MCHC: 33.6 g/dL (ref 32.0–36.0)
Monocyte #: 0.4 x10 3/mm (ref 0.2–1.0)
Neutrophil %: 75.6 %
RBC: 2.51 10*6/uL — ABNORMAL LOW (ref 4.40–5.90)
RDW: 14.8 % — ABNORMAL HIGH (ref 11.5–14.5)
WBC: 4 x10 3/mm (ref 3.8–10.6)

## 2012-07-03 LAB — CREATININE, SERUM
Creatinine: 2.65 mg/dL — ABNORMAL HIGH (ref 0.60–1.30)
EGFR (African American): 27 — ABNORMAL LOW
EGFR (Non-African Amer.): 23 — ABNORMAL LOW

## 2012-07-10 ENCOUNTER — Ambulatory Visit: Payer: Self-pay | Admitting: Urology

## 2012-07-10 LAB — PROTIME-INR: Prothrombin Time: 14.5 secs (ref 11.5–14.7)

## 2012-07-17 LAB — CBC CANCER CENTER
Basophil %: 0.2 %
Eosinophil #: 0.1 x10 3/mm (ref 0.0–0.7)
Eosinophil %: 4.4 %
HGB: 8 g/dL — ABNORMAL LOW (ref 13.0–18.0)
Lymphocyte %: 12.6 %
Monocyte #: 0.4 x10 3/mm (ref 0.2–1.0)
Monocyte %: 13.9 %
Neutrophil #: 2 x10 3/mm (ref 1.4–6.5)
Platelet: 133 x10 3/mm — ABNORMAL LOW (ref 150–440)
RBC: 2.42 10*6/uL — ABNORMAL LOW (ref 4.40–5.90)

## 2012-07-17 LAB — BASIC METABOLIC PANEL
Anion Gap: 10 (ref 7–16)
BUN: 50 mg/dL — ABNORMAL HIGH (ref 7–18)
Chloride: 106 mmol/L (ref 98–107)
Co2: 22 mmol/L (ref 21–32)
Creatinine: 2.7 mg/dL — ABNORMAL HIGH (ref 0.60–1.30)
EGFR (Non-African Amer.): 22 — ABNORMAL LOW
Glucose: 104 mg/dL — ABNORMAL HIGH (ref 65–99)
Osmolality: 289 (ref 275–301)
Potassium: 4.6 mmol/L (ref 3.5–5.1)
Sodium: 138 mmol/L (ref 136–145)

## 2012-07-17 LAB — HEPATIC FUNCTION PANEL A (ARMC)
Albumin: 3.1 g/dL — ABNORMAL LOW (ref 3.4–5.0)
Alkaline Phosphatase: 80 U/L (ref 50–136)
Bilirubin,Total: 0.2 mg/dL (ref 0.2–1.0)
SGOT(AST): 22 U/L (ref 15–37)
Total Protein: 7.2 g/dL (ref 6.4–8.2)

## 2012-07-24 LAB — CBC CANCER CENTER
Eosinophil %: 2.5 %
HCT: 22 % — ABNORMAL LOW (ref 40.0–52.0)
Lymphocyte #: 0.4 x10 3/mm — ABNORMAL LOW (ref 1.0–3.6)
MCH: 32.8 pg (ref 26.0–34.0)
Monocyte #: 0.4 x10 3/mm (ref 0.2–1.0)
Monocyte %: 13.4 %
Neutrophil #: 1.9 x10 3/mm (ref 1.4–6.5)
Neutrophil %: 68.2 %
Platelet: 132 x10 3/mm — ABNORMAL LOW (ref 150–440)
RBC: 2.27 10*6/uL — ABNORMAL LOW (ref 4.40–5.90)
RDW: 15 % — ABNORMAL HIGH (ref 11.5–14.5)

## 2012-07-24 LAB — BASIC METABOLIC PANEL
BUN: 57 mg/dL — ABNORMAL HIGH (ref 7–18)
Calcium, Total: 8.3 mg/dL — ABNORMAL LOW (ref 8.5–10.1)
Chloride: 110 mmol/L — ABNORMAL HIGH (ref 98–107)
Creatinine: 2.93 mg/dL — ABNORMAL HIGH (ref 0.60–1.30)
EGFR (African American): 23 — ABNORMAL LOW
Potassium: 5 mmol/L (ref 3.5–5.1)

## 2012-07-31 LAB — CBC CANCER CENTER
Basophil #: 0 x10 3/mm (ref 0.0–0.1)
Basophil %: 0.5 %
Eosinophil #: 0.1 x10 3/mm (ref 0.0–0.7)
HGB: 7.9 g/dL — ABNORMAL LOW (ref 13.0–18.0)
Lymphocyte #: 0.4 x10 3/mm — ABNORMAL LOW (ref 1.0–3.6)
Lymphocyte %: 10.7 %
MCH: 33 pg (ref 26.0–34.0)
MCHC: 34.2 g/dL (ref 32.0–36.0)
MCV: 97 fL (ref 80–100)
Monocyte #: 0.3 x10 3/mm (ref 0.2–1.0)
Monocyte %: 9.6 %
RBC: 2.39 10*6/uL — ABNORMAL LOW (ref 4.40–5.90)
RDW: 16.1 % — ABNORMAL HIGH (ref 11.5–14.5)
WBC: 3.5 x10 3/mm — ABNORMAL LOW (ref 3.8–10.6)

## 2012-07-31 LAB — BASIC METABOLIC PANEL
Anion Gap: 13 (ref 7–16)
Co2: 22 mmol/L (ref 21–32)
Creatinine: 2.8 mg/dL — ABNORMAL HIGH (ref 0.60–1.30)
EGFR (African American): 25 — ABNORMAL LOW
Osmolality: 295 (ref 275–301)

## 2012-08-01 ENCOUNTER — Ambulatory Visit: Payer: Self-pay | Admitting: Internal Medicine

## 2012-08-21 LAB — CBC CANCER CENTER
Basophil #: 0 x10 3/mm (ref 0.0–0.1)
Eosinophil #: 0 x10 3/mm (ref 0.0–0.7)
Eosinophil %: 1 %
HCT: 21.8 % — ABNORMAL LOW (ref 40.0–52.0)
HGB: 7.5 g/dL — ABNORMAL LOW (ref 13.0–18.0)
Lymphocyte #: 0.6 x10 3/mm — ABNORMAL LOW (ref 1.0–3.6)
Lymphocyte %: 17.1 %
MCH: 33.6 pg (ref 26.0–34.0)
MCV: 97 fL (ref 80–100)
Monocyte #: 0.4 x10 3/mm (ref 0.2–1.0)
Monocyte %: 12.2 %
Platelet: 181 x10 3/mm (ref 150–440)
RDW: 16.1 % — ABNORMAL HIGH (ref 11.5–14.5)
WBC: 3.7 x10 3/mm — ABNORMAL LOW (ref 3.8–10.6)

## 2012-08-21 LAB — BASIC METABOLIC PANEL
Anion Gap: 12 (ref 7–16)
BUN: 72 mg/dL — ABNORMAL HIGH (ref 7–18)
Calcium, Total: 8.5 mg/dL (ref 8.5–10.1)
Creatinine: 3.12 mg/dL — ABNORMAL HIGH (ref 0.60–1.30)
EGFR (African American): 22 — ABNORMAL LOW
EGFR (Non-African Amer.): 19 — ABNORMAL LOW
Osmolality: 300 (ref 275–301)
Potassium: 5.7 mmol/L — ABNORMAL HIGH (ref 3.5–5.1)

## 2012-08-22 ENCOUNTER — Ambulatory Visit: Payer: Self-pay | Admitting: Urology

## 2012-08-22 LAB — POTASSIUM: Potassium: 4.6 mmol/L (ref 3.5–5.1)

## 2012-08-23 LAB — BASIC METABOLIC PANEL
Anion Gap: 10 (ref 7–16)
Calcium, Total: 8.2 mg/dL — ABNORMAL LOW (ref 8.5–10.1)
Co2: 23 mmol/L (ref 21–32)
Creatinine: 2.92 mg/dL — ABNORMAL HIGH (ref 0.60–1.30)
EGFR (African American): 24 — ABNORMAL LOW
Osmolality: 302 (ref 275–301)
Potassium: 4.9 mmol/L (ref 3.5–5.1)
Sodium: 142 mmol/L (ref 136–145)

## 2012-09-01 ENCOUNTER — Ambulatory Visit: Payer: Self-pay | Admitting: Internal Medicine

## 2012-09-18 LAB — HEPATIC FUNCTION PANEL A (ARMC)
Albumin: 3.2 g/dL — ABNORMAL LOW (ref 3.4–5.0)
Alkaline Phosphatase: 78 U/L (ref 50–136)
Bilirubin, Direct: 0.1 mg/dL (ref 0.00–0.20)
SGOT(AST): 18 U/L (ref 15–37)
SGPT (ALT): 20 U/L (ref 12–78)
Total Protein: 7.3 g/dL (ref 6.4–8.2)

## 2012-09-18 LAB — BASIC METABOLIC PANEL
BUN: 55 mg/dL — ABNORMAL HIGH (ref 7–18)
Chloride: 107 mmol/L (ref 98–107)
Co2: 21 mmol/L (ref 21–32)
EGFR (African American): 24 — ABNORMAL LOW
EGFR (Non-African Amer.): 20 — ABNORMAL LOW
Glucose: 111 mg/dL — ABNORMAL HIGH (ref 65–99)
Potassium: 4.8 mmol/L (ref 3.5–5.1)

## 2012-09-18 LAB — CBC CANCER CENTER
Basophil %: 0.5 %
HCT: 20.5 % — ABNORMAL LOW (ref 40.0–52.0)
HGB: 7.2 g/dL — ABNORMAL LOW (ref 13.0–18.0)
MCHC: 35.1 g/dL (ref 32.0–36.0)
Monocyte %: 10.7 %
Neutrophil #: 3.3 x10 3/mm (ref 1.4–6.5)
Neutrophil %: 74.7 %
RBC: 2.07 10*6/uL — ABNORMAL LOW (ref 4.40–5.90)
RDW: 14.2 % (ref 11.5–14.5)
WBC: 4.5 x10 3/mm (ref 3.8–10.6)

## 2012-09-20 ENCOUNTER — Ambulatory Visit: Payer: Self-pay | Admitting: Urology

## 2012-09-20 LAB — PROTIME-INR: INR: 1.1

## 2012-09-20 LAB — PLATELET COUNT: Platelet: 218 10*3/uL (ref 150–440)

## 2012-09-20 LAB — APTT: Activated PTT: 31.9 secs (ref 23.6–35.9)

## 2012-10-01 ENCOUNTER — Ambulatory Visit: Payer: Self-pay | Admitting: Internal Medicine

## 2012-10-29 ENCOUNTER — Ambulatory Visit: Payer: Self-pay | Admitting: Urology

## 2012-10-29 DIAGNOSIS — I10 Essential (primary) hypertension: Secondary | ICD-10-CM

## 2012-10-29 LAB — BASIC METABOLIC PANEL
Anion Gap: 8 (ref 7–16)
Chloride: 108 mmol/L — ABNORMAL HIGH (ref 98–107)
Co2: 21 mmol/L (ref 21–32)
Creatinine: 2.57 mg/dL — ABNORMAL HIGH (ref 0.60–1.30)
EGFR (African American): 28 — ABNORMAL LOW
EGFR (Non-African Amer.): 24 — ABNORMAL LOW
Glucose: 140 mg/dL — ABNORMAL HIGH (ref 65–99)
Potassium: 4.7 mmol/L (ref 3.5–5.1)
Sodium: 137 mmol/L (ref 136–145)

## 2012-10-29 LAB — HEMOGLOBIN: HGB: 7.1 g/dL — ABNORMAL LOW (ref 13.0–18.0)

## 2012-11-06 ENCOUNTER — Inpatient Hospital Stay: Payer: Self-pay | Admitting: Urology

## 2012-11-06 LAB — CBC WITH DIFFERENTIAL/PLATELET
Basophil #: 0 10*3/uL (ref 0.0–0.1)
Basophil %: 0.1 %
Eosinophil #: 0 10*3/uL (ref 0.0–0.7)
Lymphocyte #: 0.3 10*3/uL — ABNORMAL LOW (ref 1.0–3.6)
Monocyte %: 11.1 %
Neutrophil #: 5 10*3/uL (ref 1.4–6.5)
Neutrophil %: 83 %
RDW: 13.1 % (ref 11.5–14.5)

## 2012-11-06 LAB — BASIC METABOLIC PANEL
Anion Gap: 5 — ABNORMAL LOW (ref 7–16)
BUN: 44 mg/dL — ABNORMAL HIGH (ref 7–18)
Calcium, Total: 8.1 mg/dL — ABNORMAL LOW (ref 8.5–10.1)
Creatinine: 2.41 mg/dL — ABNORMAL HIGH (ref 0.60–1.30)
Osmolality: 295 (ref 275–301)

## 2012-11-07 LAB — CBC WITH DIFFERENTIAL/PLATELET
Basophil %: 0.4 %
Eosinophil %: 1.4 %
HCT: 27.9 % — ABNORMAL LOW (ref 40.0–52.0)
HGB: 9.7 g/dL — ABNORMAL LOW (ref 13.0–18.0)
Lymphocyte %: 8.2 %
MCHC: 34.7 g/dL (ref 32.0–36.0)
Monocyte #: 0.7 x10 3/mm (ref 0.2–1.0)
Neutrophil #: 4.4 10*3/uL (ref 1.4–6.5)
Platelet: 177 10*3/uL (ref 150–440)
RDW: 13.5 % (ref 11.5–14.5)
WBC: 5.6 10*3/uL (ref 3.8–10.6)

## 2012-11-07 LAB — BASIC METABOLIC PANEL
Calcium, Total: 8.3 mg/dL — ABNORMAL LOW (ref 8.5–10.1)
Chloride: 107 mmol/L (ref 98–107)
Co2: 22 mmol/L (ref 21–32)
Creatinine: 2.22 mg/dL — ABNORMAL HIGH (ref 0.60–1.30)
EGFR (African American): 33 — ABNORMAL LOW
EGFR (Non-African Amer.): 28 — ABNORMAL LOW
Osmolality: 285 (ref 275–301)
Sodium: 136 mmol/L (ref 136–145)

## 2012-11-08 LAB — CBC WITH DIFFERENTIAL/PLATELET
Basophil #: 0 10*3/uL (ref 0.0–0.1)
Basophil %: 0.2 %
Eosinophil #: 0 10*3/uL (ref 0.0–0.7)
Eosinophil %: 0.1 %
HGB: 9.3 g/dL — ABNORMAL LOW (ref 13.0–18.0)
Lymphocyte #: 0.5 10*3/uL — ABNORMAL LOW (ref 1.0–3.6)
Lymphocyte %: 6.7 %
MCH: 33.1 pg (ref 26.0–34.0)
Monocyte %: 10.9 %
Neutrophil #: 6 10*3/uL (ref 1.4–6.5)
Neutrophil %: 82.1 %
Platelet: 173 10*3/uL (ref 150–440)
RBC: 2.81 10*6/uL — ABNORMAL LOW (ref 4.40–5.90)
RDW: 13.7 % (ref 11.5–14.5)

## 2012-11-08 LAB — BASIC METABOLIC PANEL
Anion Gap: 6 — ABNORMAL LOW (ref 7–16)
BUN: 40 mg/dL — ABNORMAL HIGH (ref 7–18)
Calcium, Total: 8.1 mg/dL — ABNORMAL LOW (ref 8.5–10.1)
Chloride: 104 mmol/L (ref 98–107)
Creatinine: 1.95 mg/dL — ABNORMAL HIGH (ref 0.60–1.30)
EGFR (African American): 38 — ABNORMAL LOW
EGFR (Non-African Amer.): 33 — ABNORMAL LOW
Glucose: 189 mg/dL — ABNORMAL HIGH (ref 65–99)
Potassium: 4.5 mmol/L (ref 3.5–5.1)
Sodium: 134 mmol/L — ABNORMAL LOW (ref 136–145)

## 2012-11-18 LAB — CBC
MCH: 32.7 pg (ref 26.0–34.0)
MCV: 94 fL (ref 80–100)
RBC: 2.54 10*6/uL — ABNORMAL LOW (ref 4.40–5.90)
RDW: 13.4 % (ref 11.5–14.5)
WBC: 10.5 10*3/uL (ref 3.8–10.6)

## 2012-11-18 LAB — COMPREHENSIVE METABOLIC PANEL
Albumin: 2.7 g/dL — ABNORMAL LOW (ref 3.4–5.0)
Alkaline Phosphatase: 69 U/L (ref 50–136)
Anion Gap: 7 (ref 7–16)
BUN: 54 mg/dL — ABNORMAL HIGH (ref 7–18)
Chloride: 102 mmol/L (ref 98–107)
EGFR (African American): 27 — ABNORMAL LOW
Glucose: 106 mg/dL — ABNORMAL HIGH (ref 65–99)
SGPT (ALT): 16 U/L (ref 12–78)
Sodium: 132 mmol/L — ABNORMAL LOW (ref 136–145)

## 2012-11-18 LAB — URINALYSIS, COMPLETE
Bilirubin,UR: NEGATIVE
Glucose,UR: NEGATIVE mg/dL (ref 0–75)
Hyaline Cast: 2
Ketone: NEGATIVE
Ph: 5 (ref 4.5–8.0)
Protein: 100
RBC,UR: 32 /HPF (ref 0–5)
Specific Gravity: 1.015 (ref 1.003–1.030)

## 2012-11-19 ENCOUNTER — Inpatient Hospital Stay: Payer: Self-pay | Admitting: Internal Medicine

## 2012-11-20 LAB — BASIC METABOLIC PANEL
Anion Gap: 7 (ref 7–16)
Calcium, Total: 8.3 mg/dL — ABNORMAL LOW (ref 8.5–10.1)
Co2: 23 mmol/L (ref 21–32)
EGFR (Non-African Amer.): 24 — ABNORMAL LOW
Glucose: 101 mg/dL — ABNORMAL HIGH (ref 65–99)
Osmolality: 279 (ref 275–301)
Sodium: 134 mmol/L — ABNORMAL LOW (ref 136–145)

## 2012-11-21 LAB — CULTURE, BLOOD (SINGLE)

## 2012-11-21 LAB — BASIC METABOLIC PANEL
Anion Gap: 5 — ABNORMAL LOW (ref 7–16)
BUN: 42 mg/dL — ABNORMAL HIGH (ref 7–18)
Calcium, Total: 8.5 mg/dL (ref 8.5–10.1)
Creatinine: 2.54 mg/dL — ABNORMAL HIGH (ref 0.60–1.30)
EGFR (African American): 28 — ABNORMAL LOW
EGFR (Non-African Amer.): 24 — ABNORMAL LOW
Potassium: 4 mmol/L (ref 3.5–5.1)

## 2012-11-22 LAB — BASIC METABOLIC PANEL
Co2: 25 mmol/L (ref 21–32)
Creatinine: 2.3 mg/dL — ABNORMAL HIGH (ref 0.60–1.30)
EGFR (African American): 31 — ABNORMAL LOW
Glucose: 83 mg/dL (ref 65–99)
Potassium: 4 mmol/L (ref 3.5–5.1)
Sodium: 135 mmol/L — ABNORMAL LOW (ref 136–145)

## 2012-11-22 LAB — PROTIME-INR: INR: 1.2

## 2012-11-22 LAB — APTT: Activated PTT: 32 secs (ref 23.6–35.9)

## 2012-11-23 LAB — BASIC METABOLIC PANEL
Anion Gap: 6 — ABNORMAL LOW (ref 7–16)
BUN: 39 mg/dL — ABNORMAL HIGH (ref 7–18)
Chloride: 107 mmol/L (ref 98–107)
Co2: 24 mmol/L (ref 21–32)
EGFR (African American): 30 — ABNORMAL LOW
EGFR (Non-African Amer.): 26 — ABNORMAL LOW
Glucose: 208 mg/dL — ABNORMAL HIGH (ref 65–99)
Osmolality: 289 (ref 275–301)
Potassium: 3.9 mmol/L (ref 3.5–5.1)
Sodium: 137 mmol/L (ref 136–145)

## 2012-12-01 ENCOUNTER — Other Ambulatory Visit: Payer: Self-pay

## 2012-12-01 LAB — CBC WITH DIFFERENTIAL/PLATELET
Basophil #: 0 10*3/uL (ref 0.0–0.1)
Basophil %: 0.3 %
HCT: 21 % — ABNORMAL LOW (ref 40.0–52.0)
HGB: 7.3 g/dL — ABNORMAL LOW (ref 13.0–18.0)
Lymphocyte #: 0.5 10*3/uL — ABNORMAL LOW (ref 1.0–3.6)
Lymphocyte %: 6.5 %
MCHC: 35 g/dL (ref 32.0–36.0)
MCV: 93 fL (ref 80–100)
Neutrophil %: 85.1 %
Platelet: 296 10*3/uL (ref 150–440)
RBC: 2.26 10*6/uL — ABNORMAL LOW (ref 4.40–5.90)
WBC: 7.4 10*3/uL (ref 3.8–10.6)

## 2012-12-01 LAB — COMPREHENSIVE METABOLIC PANEL
Albumin: 2.4 g/dL — ABNORMAL LOW (ref 3.4–5.0)
Alkaline Phosphatase: 66 U/L (ref 50–136)
Anion Gap: 8 (ref 7–16)
BUN: 40 mg/dL — ABNORMAL HIGH (ref 7–18)
Bilirubin,Total: 0.2 mg/dL (ref 0.2–1.0)
Calcium, Total: 8.3 mg/dL — ABNORMAL LOW (ref 8.5–10.1)
Co2: 24 mmol/L (ref 21–32)
Creatinine: 2.18 mg/dL — ABNORMAL HIGH (ref 0.60–1.30)
EGFR (African American): 34 — ABNORMAL LOW
EGFR (Non-African Amer.): 29 — ABNORMAL LOW
Glucose: 107 mg/dL — ABNORMAL HIGH (ref 65–99)
Osmolality: 286 (ref 275–301)
Potassium: 4.5 mmol/L (ref 3.5–5.1)
SGOT(AST): 13 U/L — ABNORMAL LOW (ref 15–37)
SGPT (ALT): 12 U/L (ref 12–78)
Sodium: 138 mmol/L (ref 136–145)

## 2012-12-01 LAB — SEDIMENTATION RATE: Erythrocyte Sed Rate: 112 mm/hr — ABNORMAL HIGH (ref 0–20)

## 2012-12-02 ENCOUNTER — Ambulatory Visit: Payer: Self-pay | Admitting: Internal Medicine

## 2012-12-08 ENCOUNTER — Other Ambulatory Visit: Payer: Self-pay

## 2012-12-08 LAB — COMPREHENSIVE METABOLIC PANEL
Albumin: 2.4 g/dL — ABNORMAL LOW (ref 3.4–5.0)
Anion Gap: 6 — ABNORMAL LOW (ref 7–16)
BUN: 39 mg/dL — ABNORMAL HIGH (ref 7–18)
Calcium, Total: 8.3 mg/dL — ABNORMAL LOW (ref 8.5–10.1)
Chloride: 107 mmol/L (ref 98–107)
EGFR (African American): 34 — ABNORMAL LOW
Glucose: 121 mg/dL — ABNORMAL HIGH (ref 65–99)
Potassium: 4.3 mmol/L (ref 3.5–5.1)
SGOT(AST): 14 U/L — ABNORMAL LOW (ref 15–37)
SGPT (ALT): 10 U/L — ABNORMAL LOW (ref 12–78)
Sodium: 138 mmol/L (ref 136–145)
Total Protein: 6.5 g/dL (ref 6.4–8.2)

## 2012-12-08 LAB — CBC WITH DIFFERENTIAL/PLATELET
Basophil #: 0 10*3/uL (ref 0.0–0.1)
Eosinophil %: 0.8 %
Lymphocyte #: 0.4 10*3/uL — ABNORMAL LOW (ref 1.0–3.6)
MCH: 32.1 pg (ref 26.0–34.0)
MCHC: 34.6 g/dL (ref 32.0–36.0)
Monocyte #: 0.5 x10 3/mm (ref 0.2–1.0)
Neutrophil #: 4 10*3/uL (ref 1.4–6.5)
Platelet: 265 10*3/uL (ref 150–440)
RBC: 2.09 10*6/uL — ABNORMAL LOW (ref 4.40–5.90)

## 2012-12-11 ENCOUNTER — Ambulatory Visit: Payer: Self-pay | Admitting: Internal Medicine

## 2012-12-12 LAB — COMPREHENSIVE METABOLIC PANEL
Albumin: 2.6 g/dL — ABNORMAL LOW (ref 3.4–5.0)
Alkaline Phosphatase: 74 U/L (ref 50–136)
Anion Gap: 6 — ABNORMAL LOW (ref 7–16)
BUN: 37 mg/dL — ABNORMAL HIGH (ref 7–18)
Bilirubin,Total: 0.6 mg/dL (ref 0.2–1.0)
Calcium, Total: 8.4 mg/dL — ABNORMAL LOW (ref 8.5–10.1)
Chloride: 104 mmol/L (ref 98–107)
EGFR (African American): 36 — ABNORMAL LOW
EGFR (Non-African Amer.): 31 — ABNORMAL LOW
Osmolality: 274 (ref 275–301)
Potassium: 4.4 mmol/L (ref 3.5–5.1)
SGOT(AST): 18 U/L (ref 15–37)
SGPT (ALT): 12 U/L (ref 12–78)
Sodium: 133 mmol/L — ABNORMAL LOW (ref 136–145)

## 2012-12-12 LAB — URINALYSIS, COMPLETE
Bilirubin,UR: NEGATIVE
Glucose,UR: NEGATIVE mg/dL (ref 0–75)
Ketone: NEGATIVE
Ph: 6 (ref 4.5–8.0)
Protein: 100
RBC,UR: 629 /HPF (ref 0–5)
Squamous Epithelial: NONE SEEN

## 2012-12-12 LAB — CBC
MCHC: 34.8 g/dL (ref 32.0–36.0)
RBC: 2.82 10*6/uL — ABNORMAL LOW (ref 4.40–5.90)
RDW: 14.3 % (ref 11.5–14.5)

## 2012-12-13 ENCOUNTER — Inpatient Hospital Stay: Payer: Self-pay | Admitting: Internal Medicine

## 2012-12-14 LAB — PLATELET COUNT: Platelet: 270 10*3/uL (ref 150–440)

## 2012-12-15 LAB — BASIC METABOLIC PANEL
Anion Gap: 6 — ABNORMAL LOW (ref 7–16)
Calcium, Total: 8.1 mg/dL — ABNORMAL LOW (ref 8.5–10.1)
Chloride: 107 mmol/L (ref 98–107)
Co2: 22 mmol/L (ref 21–32)
Creatinine: 2.12 mg/dL — ABNORMAL HIGH (ref 0.60–1.30)
EGFR (African American): 35 — ABNORMAL LOW
EGFR (Non-African Amer.): 30 — ABNORMAL LOW
Glucose: 128 mg/dL — ABNORMAL HIGH (ref 65–99)
Potassium: 3.9 mmol/L (ref 3.5–5.1)
Sodium: 135 mmol/L — ABNORMAL LOW (ref 136–145)

## 2012-12-15 LAB — CBC WITH DIFFERENTIAL/PLATELET
Basophil #: 0 10*3/uL (ref 0.0–0.1)
Basophil %: 0.1 %
HCT: 24 % — ABNORMAL LOW (ref 40.0–52.0)
HGB: 8.4 g/dL — ABNORMAL LOW (ref 13.0–18.0)
MCH: 31.9 pg (ref 26.0–34.0)
MCHC: 35 g/dL (ref 32.0–36.0)
MCV: 91 fL (ref 80–100)
Monocyte #: 0.8 x10 3/mm (ref 0.2–1.0)
Platelet: 240 10*3/uL (ref 150–440)
RBC: 2.63 10*6/uL — ABNORMAL LOW (ref 4.40–5.90)
WBC: 6.9 10*3/uL (ref 3.8–10.6)

## 2012-12-16 LAB — URINALYSIS, COMPLETE
Bilirubin,UR: NEGATIVE
Ketone: NEGATIVE
RBC,UR: 25 /HPF (ref 0–5)
Specific Gravity: 1.005 (ref 1.003–1.030)
Squamous Epithelial: NONE SEEN

## 2012-12-17 LAB — URINE CULTURE

## 2012-12-26 ENCOUNTER — Ambulatory Visit: Payer: Self-pay | Admitting: Urology

## 2012-12-26 LAB — PLATELET COUNT: Platelet: 309 10*3/uL (ref 150–440)

## 2012-12-26 LAB — PROTIME-INR: INR: 1

## 2012-12-26 LAB — APTT: Activated PTT: 28.2 secs (ref 23.6–35.9)

## 2012-12-30 ENCOUNTER — Ambulatory Visit: Payer: Self-pay | Admitting: Urology

## 2013-01-01 ENCOUNTER — Ambulatory Visit: Payer: Self-pay | Admitting: Internal Medicine

## 2013-01-07 ENCOUNTER — Ambulatory Visit: Payer: Self-pay | Admitting: Urology

## 2013-01-09 LAB — PATHOLOGY REPORT

## 2013-01-15 ENCOUNTER — Ambulatory Visit: Payer: Self-pay | Admitting: Internal Medicine

## 2013-01-15 LAB — CBC CANCER CENTER
Eosinophil %: 0.1 %
HCT: 21.8 % — ABNORMAL LOW (ref 40.0–52.0)
MCHC: 34.3 g/dL (ref 32.0–36.0)
Monocyte %: 8.7 %
Neutrophil %: 85.6 %
RDW: 14.4 % (ref 11.5–14.5)

## 2013-01-15 LAB — RETICULOCYTES: Absolute Retic Count: 0.0489 10*6/uL (ref 0.019–0.186)

## 2013-01-15 LAB — IRON AND TIBC
Iron Bind.Cap.(Total): 162 ug/dL — ABNORMAL LOW (ref 250–450)
Iron Saturation: 20 %
Iron: 32 ug/dL — ABNORMAL LOW (ref 65–175)
Unbound Iron-Bind.Cap.: 130 ug/dL

## 2013-01-22 LAB — CBC CANCER CENTER
HCT: 27.2 % — ABNORMAL LOW (ref 40.0–52.0)
Lymphocyte #: 0.3 x10 3/mm — ABNORMAL LOW (ref 1.0–3.6)
MCHC: 34.2 g/dL (ref 32.0–36.0)
MCV: 90 fL (ref 80–100)
Monocyte %: 8.6 %
Neutrophil #: 8.1 x10 3/mm — ABNORMAL HIGH (ref 1.4–6.5)
Platelet: 292 x10 3/mm (ref 150–440)
WBC: 9.3 x10 3/mm (ref 3.8–10.6)

## 2013-02-01 ENCOUNTER — Ambulatory Visit: Payer: Self-pay | Admitting: Internal Medicine

## 2013-03-03 DEATH — deceased

## 2013-09-26 IMAGING — CT CT GUIDE TUBE PLACEMENT
1 of 8 series · 3 of 16 positions shown, 4 images · non-contrast
Comparison: none

REASON FOR EXAM: obstructive renal failure
COMMENTS:

[Series 2: soft tissue · axial · 0.78mm/px · z∈[-682,-580]mm · 3 of 69 slices shown, 4 images]
[im 18/69  soft-tissue]
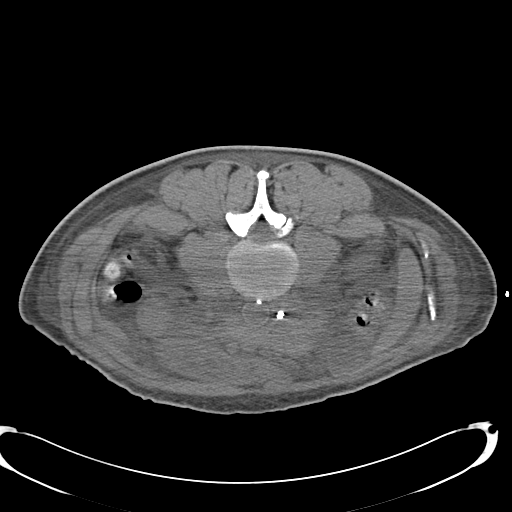
[im 18/69  bone]
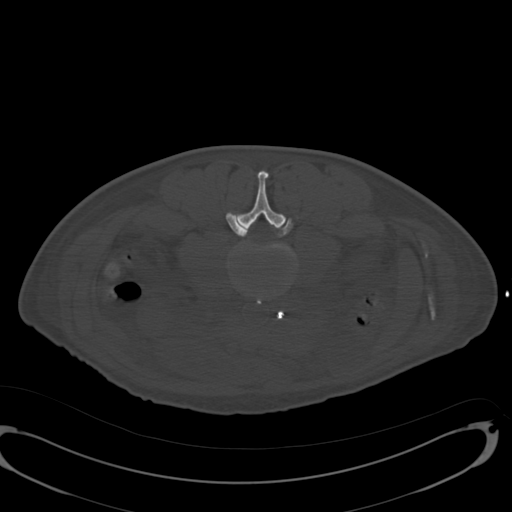
[im 35/69  soft-tissue]
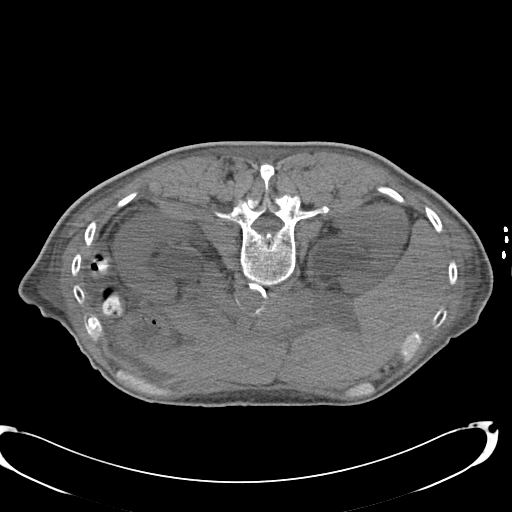
[im 52/69  soft-tissue]
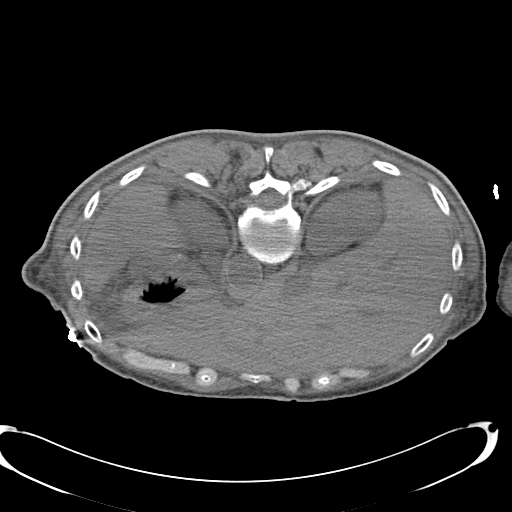

[3 of 16 positions shown; findings below may reference images not displayed]

PROCEDURE:     CT  - CT GUIDED TUBE PLACEMENT  - January 17, 2012 [DATE]

RESULT:     Comparison: CT of the abdomen and pelvis 01/14/2012

Procedure:

Clinical assessment was performed and informed consent obtained.  The
patient was brought to the CT suite and placed prone on the table. A focused
abdominal CT without contrast was performed. This demonstrated bilateral
hydronephrosis.

The bilateral flanks prepped and draped in the usual sterile fashion. The
overlying skin was anesthetized with 1% lidocaine. A 21 gauge micropuncture
needle was inserted and the location confirmed by CT fluoroscopy. The needle
was advanced and placement within the right renal collecting system
confirmed by CT and syringe aspiration of urine.

The 21 gauge needle was exchanged for a 5 French dilator over a wire. After
tract dilatation, an 8 French APDL pigtail catheter into the right renal
pelvis. The catheter was secured to the skin using 2-0 Vicryl, dressed, and
connected to a drainage bag.

Then, attention was turned to the left kidney. The overlying skin was
anesthetized with 1% lidocaine. A 21 gauge micropuncture needle was inserted
and the location confirmed by CT fluoroscopy. The needle was advanced and
placement within the left renal collecting system confirmed by CT and
syringe aspiration of urine.

The 21 gauge needle was exchanged for a 5 French dilator over a wire. After
tract dilatation, an 8 French APDL pigtail catheter into the left renal
pelvis. The catheter was secured to the skin using 2-0 Vicryl, dressed, and
connected to a drainage bag.

The procedure was well tolerated and without complication. Hemostasis was
achieved. The patient was transferred to the recovery unit in stable
condition.

Sedation: 0.5 mg of midazolam and 50 mcg of fentanyl.

Medications: Rocephin 1 gram.
IMPRESSION: Successful CT-guided bilateral nephrostomy tube placement.

## 2014-07-21 NOTE — Consult Note (Signed)
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  Electronic Signatures: Smith Robertope, Jahnay Lantier S (MD)  (Signed on 22-Oct-13 07:10)  Authored  Last Updated: 22-Oct-13 07:10 by Smith Robertope, Calee Nugent S (MD)

## 2014-07-21 NOTE — Consult Note (Signed)
ONCOLOGY followup - states that he continues to do well, eating better.  Denies fever or chills.ambulating short distances.  Denies bleeding issues.sitting in bed, alert and oriented, NAD           vitals - 98.2, 77, 18, 127/72, 98% on room air           lungs - b/l good air entry           abd -soft, NT           skin - no major bruising or ecchymosis. WBC 2200, ANC [id number], hemoglobin 8.3, platelets 45. Recent Cr 1.87. Blood c/s shows Gram + cocci Enterococcus faecalis sensitive to ampicillin  advanced bladder cancer admitted with febrile neutropenia, has Enterococcus faecalis bacteremia. Clinically feeling better, remains afebrile.  Agree with hospitalist plan to wait till repeat blood cultures to make sure it is clearing, then discharge on oral antibiotic.  ANC is still slightly low at 1200, will give Neupogen 300 mcg SQ today. Agree wtih ongoing broad spectrum antibiotics, supportive treatment. Continue to monitor CBC and transfuse as indicated (PRBC if hemoglobin is < 7 g, and transfuse platelets if 10 or less, or at higher counts if bleeding symptoms).  Oncology will continue to follow.  If patient discharged over the weekend, he was advised to keep outpatient appointment at cancer Center on November 27 the same.   Electronic Signatures: Izola PricePandit, Anastasya Jewell Raj (MD)  (Signed on 22-Nov-13 13:56)  Authored  Last Updated: 22-Nov-13 13:56 by Izola PricePandit, Arneda Sappington Raj (MD)

## 2014-07-21 NOTE — Consult Note (Signed)
ONCOLOGY Consult Note -   Referring MD: Dr. Seth BakeV. ShahMD: Dr.Deondrea Markos  Reason for Consult: Bladder Tumor   74 year old male with past medical history of diabetes mellitus, hypertension and chronic kidney disease who was admitted with c/o progressive weakness and lethargy. He was found to have acute on chronic renal failure, transiently has been on dialysis, Cr has been upto 10.45 and is better at 2.24 today. He was found to have obstructive nephropathy and now has nephrostomy tubes. He had cystoscopy by Dr. Achilles Dunkope which reports large agrressive bladder base tumor with likely muscle invasion, final biopsy report is pending. Clinically states he is still weak but beginning to feel and eat better. No fevers. Denies pain issues. No new cough, chest pain or shortness of breath.  MEDICAL HISTORY/PAST SURGICAL HISTORY:  Diabetes mellitus.  Chronic kidney disease, last creatinine 2.5 in August.  Hypertension. surgery.  HISTORY: No tobacco, alcohol, or drug use. Works at the [employer].  HISTORY: Noncontributory, denies malignancy.  No known drug allergies.  MEDICATIONS:  Glimepiride 2 mg oral half tablet daily.  Aspirin 81 mg oral daily  OF SYSTEMS: as in HPI. No fever or night sweats. Has fatigue and weakness. Denies headaces, dizziness, epistaxis, ear or jaw painDenies cough, wheezing, hemoptysis, dyspneadenies chest pain, orthopnea, arrhythmia, palpitations, or syncope. Has lower extremity edema.Denies nausea, vomiting, diarrhea, abdominal pain, or hematemesis. Denies dysuria, hematuriano rahes or pruritisdenies bleeding SxsDenies polyuria, polydipsiaDenies new bone pains. Denies focal weakness, seizures, LOC or paresthesias in extremities. Denies anxiety or depression.  EXAM:  patient is moderately built and nourished, sitting in bed, A, O x 3, NAD. No icterus.SIGNS: 99.5, 80, 18, 146/69, 95% RAatraumatic, normocephalic. EOMs intact. No oral thrush. No cervical adenopathy. good air entry bilaterally. No  rales or rhonchi. S1S2, regular.Soft, nontender, no hepatosplenomegaly.Bilateral lower extremity edema mood calm.Cranial nerves grossly intact. No focal neurological deficits. chronic lower extremity discoloration.  WBC 6600, Hb 7, plts 185K, neutrophils 76%, Cr 2.24, potassium 3.9, Ca 597.467.   74 year old male with past medical history of diabetes mellitus, hypertension and chronic kidney disease who was admitted with c/o progressive weakness and lethargy. He was found to have acute on chronic renal failure, transiently has been on dialysis, Cr has been upto 10.45 and is better at 2.24 today. He was found to have obstructive nephropathy and now has nephrostomy tubes. Cystoscopy by Dr. Achilles Dunkope reports large agrressive bladder base tumor with likely muscle invasion, final biopsy report is pending. CT scan of abdomen/pelvis reports severe tissue thickening of the bladder which is concerning for malignancy, there is a 4 mm calculus on the posterior aspect of the right side of the bladder which could represent a calculus within the right ureterovesical junction, there appears to be circumferential thickening of the rectal wall felt to be nonspecific with also colitis and malignancy would be of differential. Patient and wife present explained about above findings and will get PET scan for staging of bladder cancer to r/o metastatic disease. Will followup after scan is done and make further plan of management. He otherwise has no acute pain issues at this time. Patient is agreeable to this plan.     Electronic Signatures: Izola PricePandit, Neasia Fleeman Raj (MD)  (Signed on 20-Oct-13 00:10)  Authored  Last Updated: 20-Oct-13 00:10 by Izola PricePandit, Zanobia Griebel Raj (MD)

## 2014-07-21 NOTE — Consult Note (Signed)
PATIENT NAME:  William Horn, William Horn MR#:  [id number] DATE OF BIRTH:  March 01, 1941  DATE OF CONSULTATION:  01/14/2012  REFERRING PHYSICIAN:   CONSULTING PHYSICIAN:  Annice NeedyJason S. Kirrah Mustin, MD  REASON FOR CONSULTATION: Dialysis catheter placement.  HISTORY OF PRESENT ILLNESS: This is a 74 year old African American male who was admitted with acute renal failure. He has baseline chronic kidney disease, [facility]. He is also developing lethargy. He does a poor job providing history today and this was obtained from the family and the previous medical record. He has also had hypoglycemia and has been treated with dextrose on several occasions. Apparently his baseline creatinine is about 2.5. He has not had urinary retention or acute pain. He is also more anemic than previous and severely hypertensive with systolic blood pressure of about 220.  PAST MEDICAL HISTORY:  1. Diabetes mellitus.  2. Chronic kidney disease with baseline creatinine 2.5.  3. Hypertension.   PAST SURGICAL HISTORY: Cataract.   SOCIAL HISTORY: No alcohol or drug abuse. Apparently works in the [employer].   FAMILY HISTORY: No history significant for heart disease or diabetes.   ALLERGIES: No known drug allergies.   HOME MEDICATIONS:  1. Aspirin 81 mg daily. 2. Glimepiride 2 mg daily.   REVIEW OF SYSTEMS: Review is limited due to the patient's medical issues. CONSTITUTIONAL: No fevers or chills. No unintentional weight loss or gain. Positive for lethargy and weakness. EYES: No blurred or double vision. EARS: No tinnitus or ear pain. CARDIOVASCULAR: No chest pain or palpitations. RESPIRATORY: No shortness breath or cough. GASTROINTESTINAL: No nausea, vomiting, or diarrhea. He does have poor appetite. GENITOURINARY: No dysuria or hematuria. He does have decreased urine output. ENDOCRINE: No heat or cold intolerance. PSYCH: No anxiety or depression. NEUROLOGIC: No transient ischemic attack, stroke, or seizure. SKIN: No  new rash or ulcers. MUSCULOSKELETAL: Positive for lower extremity swelling.   PHYSICAL EXAMINATION:   GENERAL: This is an elderly African American male not in apparent distress lying quietly in his critical care bed.   VITAL SIGNS: Temperature 98.4, pulse 86, and blood pressure 181/86.   HEAD: Normocephalic, atraumatic.   EYES: Sclerae anicteric. Conjunctivae are clear.   EARS: Normal external appearance. Hearing is intact.   NECK: Neck is supple. No adenopathy or jugular venous distention.   HEART: Regular rate and rhythm with systolic murmur.   LUNGS: Slightly diminished particularly in the bases bilaterally.   ABDOMEN: Soft, nondistended, and nontender.   EXTREMITIES: He has moderate lower extremity edema. Feet are warm.   NEUROLOGIC: No focal deficits.   SKIN: Warm and dry.   RESULTS: Sodium 139, potassium 6.3, chloride 113, CO2 14, BUN 113, and creatinine 10.49. BNP 1498. White blood cell count 5.8, hemoglobin 7.8, and platelet count 197,000.   ASSESSMENT AND PLAN: This is a 74 year old African American male with history of chronic kidney disease. He has now progressed to renal failure. It is not clear at this time if this will be a reversible thing or if this is true end-stage renal disease. Either way urgent dialysis is necessary and a temporary dialysis catheter will be placed at the bedside.   This is a level-3 consultation.  ____________________________ Annice NeedyJason S. Lamira Borin, MD jsd:slb D: 01/14/2012 12:03:36 ET T: 01/14/2012 12:19:06 ET JOB#: [id number]  cc: Annice NeedyJason S. Jony Ladnier, MD, <Dictator> Annice NeedyJASON S Waldine Zenz MD ELECTRONICALLY SIGNED 01/15/2012 13:34

## 2014-07-21 NOTE — Consult Note (Signed)
PATIENT NAME:  William Horn, William Horn MR#:  [id number] DATE OF BIRTH:  1941-02-13  DATE OF CONSULTATION:  02/23/2012  REFERRING PHYSICIAN:  Katha HammingSnehalatha Konidena, MD CONSULTING PHYSICIAN:  Stann Mainlandavid P. Sampson GoonFitzgerald, MD  PRIMARY ONCOLOGIST: Janese BanksSandeep Pandit, MD  REASON FOR CONSULTATION: Enterococcal bacteremia.   HISTORY OF PRESENT ILLNESS: This is a very pleasant 74 year old gentleman with recently diagnosed stage IV bladder cancer and renal failure and hydronephrosis. He had bilateral nephrostomy tubes placed and started chemotherapy on 02/06/2012. He had a CBC done which showed neutropenia and was placed on empiric antibiotics; however, he developed fever and presented for admission. He also had some chills associated with the fever. He had no cough, shortness of breath, back pain, headache, or other infectious symptoms.   The patient was admitted and started on IV antibiotics. Blood cultures have revealed enterococcal bacteremia and urine culture also had enterococcus. I am consulted for further antibiotic management.   PAST MEDICAL HISTORY:  1. 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  2. Bilateral hydronephrosis status post nephrostomy tube placement.  3. Chronic renal failure, stage III.  4. Hypertension.  5. Diabetes, type 2.  PAST SURGICAL HISTORY:  1. Cystoscopy and biopsy for bladder tumor.  2. Bilateral nephrostomy tube placement.  3. Cataract surgery.  FAMILY HISTORY: The patient's father died at age 76 of unknown reason. His mother died at 4096 of natural causes.   SOCIAL HISTORY: Does not smoke, does not drink. He is married and lives with his wife. He has 10 children. He is retired from [employer].  ALLERGIES: No known drug allergies.   CURRENT MEDICATIONS: Vancomycin, meropenem, cefepime, ferrous sulfate, metoprolol, glimepiride, sliding scale insulin, and Neupogen.   PHYSICAL EXAMINATION:   VITAL SIGNS: T-max is 100.3, pulse 84, blood  pressure 153/73, and saturating 98% on room air.   GENERAL: The patient is a well-developed male in no acute distress sitting up on the side of the bed.  HEENT: Pupils are equally round and reactive to light and accommodation. Extraocular movements are intact. Sclerae anicteric. Oropharynx clear.   NECK: Supple. He has no anterior cervical, posterior cervical, or supraclavicular lymphadenopathy.   HEART: Regular with 1/6 systolic murmur.   LUNGS: Clear to auscultation bilaterally.   ABDOMEN: Soft, nondistended. No hepatosplenomegaly.  BACK: He has two nephrostomy tubes in place with clear yellow urine.   ABDOMEN: Soft, nontender, and nondistended. No hepatosplenomegaly.   EXTREMITIES:  2+ bilateral pitting edema.   NEUROLOGIC: Alert and oriented x3, grossly nonfocal neuro examination.  RESULTS: Microbiological - The patient had blood cultures done on admission which grew two out of two with enterococcus faecalis sensitive to ampicillin and linezolid. Urine culture on 02/21/2012 revealed greater than 100,000 Enterococcus faecalis and 100,000 colony forming units of an identified organism. The enterococcus was pansensitive.   The patient had white blood count on admission of 0.9 with neutrophil count of 0.4. His white blood count today is 2.2 . Hemoglobin is 8.3 and platelets 45. Creatinine today is 1.93 with an estimated GFR of 39. On admission creatinine was 2.11.   The patient had a chest x-ray done which showed no acute cardiopulmonary.   IMPRESSION: This is a 74 year old gentleman who was diagnosed with carcinoma of the bladder status post chemotherapy who also has nephrostomy tubes in place for hydronephrosis. He was admitted with neutropenic fever and found to have enterococcal faecalis bacteremia and urinary infection. He has improved and defervesced since admission.  The patient has enterococcal bacteremia  likely from his urinary tract infection and there is no evidence that this  was a chronic issue as it developed acutely. There is a chance of endocarditis, but I feel this is unlikely. There is no other evidence of metastatic foci of infection.   RECOMMENDATIONS:  1. Change antibiotic to ampicillin 3 gram IV every six hours. We will ask pharmacy to consult regarding adjustments in his renal function changes.  2. Repeat blood cultures to ensure clearance.  3. Check echocardiogram. 4. Monitor until blood culture follow up is negative. At that point, we can decide on duration of treatment. If there is no other metastatic foci of infection and no evidence of endocarditis and his blood cultures are clear, he could probably finish IV antibiotics over the weekend and be discharged on a prolonged course of oral antibiotics. He may need continued suppressive therapy depending on the plan for the nephrostomy tube.   Thank you for the consult. I would be glad to follow along with you. ____________________________ Stann Mainland. Sampson Goon, MD dpf:slb D: 02/23/2012 22:09:47 ET (Entered as incorrect work type - 09) T: 02/25/2012 13:01:26 ET JOB#: [id number] cc: Stann Mainland. Sampson Goon, MD, <Dictator> Nilda Keathley Sampson Goon MD ELECTRONICALLY SIGNED 03/05/2012 21:44

## 2014-07-21 NOTE — Consult Note (Signed)
[facility]  [facility].am still not clear on why Bilateral nephrostomy tubes were not place over the weekend.has been stated this can't be doen [facility] which is not correct.services should be available 24/7if no person was on call over the weekend who could perform the procedure, it should have been arranged for Monday by the weekend coverage, either medicine or urology.tubes are performed and managed by interventional radiology, not urology.patient was NPO Monday for the possibility of tube placement but it appears no communication to radiology or orders were entered.have discussed the situation with the patient and his wife.is [facility].does need bilateral nephrostomy tubes.  This needs to be done ASAP.will need bilateral drainage and stablization before he should be put under anesthesia for evaulation of the bladder.is the only option available [facility].should have a PSA as suggested by Dr Selena BattenKim to evaluate for prostate v/s bladdder source.does appear this has been ordered for tomorrow.bladder should be addressed, but this will likely need to be [facility] after transer and more stabelization of his overall status.there is continued delay in transfer and his status does improve with tube placement, cystoscopy can be considered in the near future.this is bladder source, he is not currently a candidate for chemotherapy as metastatic disease is certain given the presence of bilateral obstruction.order has been entered for tube placement that I can find.  This has now been entered.continue to follow for now.  Electronic Signatures: Smith Robertope, Kylyn Mcdade S (MD)  (Signed on 15-Oct-13 21:19)  Authored  Last Updated: 15-Oct-13 21:19 by Smith Robertope, Aleksia Freiman S (MD)

## 2014-07-21 NOTE — Consult Note (Signed)
ONCOLOGY followup note - overall feels better. No fever or chills today.eating fair. Denies bleeding Sxsoverall looks better, alert and oriented, NAD           vitals - 98.6, 75, 18, 144/66, 99% on RA           lungs - b/l good air entry           abd -soft, NT           skin - no major bruising or ecchymosis.WBC 1800, ANC 1200, Hb 6.3, plts 17K. Cr 1.87. Blood c/s shows Gram + cocci  advanced bladder cancer admitted with febrile neutropenia, has gram positive cocci on blood c/s. Clinically feeling better, afebrile today. Agree wtih ongoing broad spectrum antibiotics, supportive treatment. ANC is 1200, will give Neupogen 300 mcg SQ today. Continue to monitor CBC. Hb is 6.3, agree with supportive PRBC transfusion. Transfuse platelets if 10 or less, or at higher counts if bleeding symtpoms. Will continue to follow.    Electronic Signatures: Izola PricePandit, Jaide Hillenburg Raj (MD)  (Signed on 21-Nov-13 22:35)  Authored  Last Updated: 21-Nov-13 22:35 by Izola PricePandit, Holdyn Poyser Raj (MD)

## 2014-07-21 NOTE — Op Note (Signed)
PATIENT NAME:  William Horn, William Horn MR#:  [id number] DATE OF BIRTH:  Jul 16, 1940  DATE OF PROCEDURE:  01/19/2012  PREOPERATIVE DIAGNOSES:  1. Bladder mass.  2. Bilateral hydronephrosis.  3. Renal failure.   POSTOPERATIVE DIAGNOSES:  1. Bladder mass.  2. Bilateral hydronephrosis.  3. Renal failure.  PROCEDURE: Transurethral resection of bladder mass.   SURGEON: Assunta GamblesBrian Zaylyn Bergdoll, MD   ANESTHESIA: Laryngeal mask airway anesthesia.   INDICATIONS: The patient is a 74 year old African American gentleman who recently presented in renal failure. He was found to have bilateral hydronephrosis and a large thickened bladder wall and prostate region suspicious for possible bladder tumor. There was some delay in nephrostomy tube placement. His renal function abnormalities were treated with dialysis. He has had subsequent significant improvement in renal function after bilateral percutaneous nephrostomy placement. He presents for resection of bladder tumor and biopsy for diagnosis of source of the obstruction. He presents today for this purpose.   DESCRIPTION OF PROCEDURE: After informed consent was obtained, the patient was taken to the Operating Room and placed in the dorsal lithotomy position under laryngeal mask airway anesthesia. The patient was then prepped and draped in the usual standard fashion. The 22-French  rigid cystoscope was introduced into the urethra under direct vision with no urethral abnormalities noted. Upon entering the prostatic fossa, some inflammatory changes were noted consistent with Foley catheterization. Some friability of the tissue was also noted. Prominent bilobar hypertrophy was noted with complete visual obstruction upon entering the bladder. The mucosa was inspected in its entirety. The entire bladder base was noted to be covered with large bullous lesions. A large segment of solid-appearing tumor was noted extending off the bladder base predominantly on the right behind what should be  the trigone region. The trigone itself could not be identified. Ureteral orifices also could not be identified. There was prominent extension of the irregular solid-appearing tumor onto the right lateral wall. Areas of erythema were noted elsewhere throughout the bladder. Only a small portion near the dome of the bladder demonstrated relatively normal mucosa. The decision was made to proceed with transurethral resection rather than biopsy due to the extent of the tumor. The cystoscope was removed. The saline resectoscope sheath was placed utilizing the visual obturator without difficulty. The saline resectoscope loop was then utilized to remove the large solid mass on the right bladder base. This was taken to the lateral wall. An approximate 5 cm area of tissue was resected. It was taken fairly deep into the bladder wall. No definitive muscle fibers, however, could be identified. The exact depth was unable to be determined. The decision was made not to proceed any deeper due to these concerns. No significant bleeding was encountered. The entire area was then cauterized utilizing the loop. The chips were collected and will be sent for pathology analysis. The resectoscope was removed. A 22-French Foley catheter was then placed to gravity drainage without difficulty. The patient was awakened from laryngeal mask airway anesthesia. He was taken to the recovery room in stable condition. There were no problems or complications. The patient tolerated the procedure well.  ____________________________ Madolyn FriezeBrian S. Achilles Dunkope, MD bsc:cbb D: 01/20/2012 17:35:23 ET T: 01/21/2012 12:16:49 ET JOB#: [id number]  cc: Madolyn FriezeBrian S. Achilles Dunkope, MD, <Dictator> Madolyn FriezeBRIAN S Mlissa Tamayo MD ELECTRONICALLY SIGNED 01/23/2012 7:12

## 2014-07-21 NOTE — Consult Note (Signed)
Nephrostomy tube successfully placed yesterday.  There has been a remarkable improvement in the serum creatinine in short order.  With this rapid of improvement, will consider cystoscopy with bladder biopsy on Friday.  We'll need to try and coordinate with schedule.  Will further discuss with patient this afternoon or evening.  The PSA is low at 1.7.  It is therefore less likely to be prostate cancer.  A high-grade tumor however can still lead to similar findings with low PSA.  Will leave further recommendations later today.  Electronic Signatures: Smith Robertope, Antoria Lanza S (MD)  (Signed on 17-Oct-13 07:25)  Authored  Last Updated: 17-Oct-13 07:25 by Smith Robertope, Kareem Aul S (MD)

## 2014-07-21 NOTE — Discharge Summary (Signed)
PATIENT NAME:  William Horn, William Horn MR#:  [id number] DATE OF BIRTH:  03/20/1941  DATE OF ADMISSION:  01/14/2012 DATE OF DISCHARGE:  01/23/2012  DISCHARGE DIAGNOSES:  1. Obstructive uropathy secondary to bladder mass with bilateral hydronephrosis, improving status post bilateral nephrostomy tube and draining urine.  2. Possible extravesical tumor per cystoscopy.  3. Acute on chronic renal failure with creatinine 1.9, likely at baseline. 4. Hypoglycemia, now resolved.  5. Hyperkalemia, resolved with dialysis.  6. Acute on chronic blood loss anemia likely due to hematuria, hemoglobin of 7, but the patient did not want blood transfusion, on iron supplement.  7. Constipation, resolved on stool softener.   SECONDARY DIAGNOSES:  1. Diabetes. 2. Chronic kidney disease with baseline creatinine of 2.5.  3. Hypertension.   CONSULTANTS:  1. Munsoor Cherylann Ratel, MD - Nephrology. 2. Assunta Gambles, MD - Urology. 3. Physical Therapy.  4. Janese Banks, MD - Oncology.  PROCEDURES/RADIOLOGY: Right femoral trialysis catheter placement by Dr. Wyn Quaker on 01/14/2012.   Cystoscopy by Dr. Achilles Dunk on 01/19/2012 showing large solid tumor over the entire bladder base with right lateral wall dystrophic calcification.   Chest x-ray on 01/14/2012 showed no acute cardiopulmonary disease.   CT scan of the abdomen and pelvis without contrast on 01/14/2012 showed moderate bilateral hydronephrosis and ureterectasis. Severe tissue thickening of the bladder concerning for malignancy. Possible calculus within the right ureterovesical junction. Circumferential thickening of rectal wall. Colitis and malignancy would be differential consideration.   CT-guided bilateral nephrostomy tube placement by Dr. Charlies Silvers on 01/17/2012.   PET scan on 01/22/2012 showed no focal intensely PET-positive lesions.   Bilateral renal ultrasound on 01/14/2012 showed moderate bilateral hydronephrosis.   Bilateral lower extremity Doppler on 01/14/2012 showed no  evidence of deep vein thrombosis.   2-D echocardiogram on 01/14/2012 showed mildly dilated left atrium. Normal LV systolic function. Ejection fraction more than 55%. Trace tricuspid regurgitation. Impaired LV relaxation.   LABORATORY/DIAGNOSTIC DATA: Blood cultures x2 were negative. Urine culture was negative on admission.   Urinalysis on admission was negative.   Serum PSA was within normal limits with a value of 1.7. Intact PTH was within normal limits with value of 55. HBsAg was negative. Glycohemoglobin was 7. Urine eosinophil was negative.   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   HISTORY AND SHORT HOSPITAL COURSE: The patient is a 74 year old male with the above-mentioned medical problems who was admitted for worsening renal failure, anemia, and bilateral hydronephrosis which was seen on CT scan of the abdomen. Please see Dr. Mliss Fritz Elgergawy's dictated history and physical for further details. The patient also had hyperkalemia for which he required urgent hemodialysis. Vascular surgery consultation was obtained with Dr. Wyn Quaker who placed trialysis catheter in the groin and the patient was dialyzed by Dr. Cherylann Ratel emergently. Urology consultation was obtained with Dr. Achilles Dunk who recommended bilateral nephrostomy tube placement as soon as possible. This was performed on 01/17/2012 under CT guidance by interventional radiology and since then the patient's renal function started improving significantly. His creatinine was as high as up to 10.3 on 01/15/2012 and after placement of nephrostomy tube his creatinine came down with a value of 3.6 and 2.4 and on the day before discharge it was 1.9. The patient was also feeling significantly better. He did undergo cystoscopy by Dr. Achilles Dunk for evaluation of bladder tumor and was found to have a large solid tumor as dictated above. Biopsy was also performed  and was sent to  pathology, although final pathology result was not back on the date of discharge, on 01/23/2012, but considering the nature of tumor oncology consultation was obtained with Dr. Janese BanksSandeep Pandit who recommended outpatient follow-up and pathology results will be discussed at that time. The patient was feeling much better on 01/23/2012 and was discharged home in stable condition.   DISCHARGE PHYSICAL EXAMINATION:   VITAL SIGNS: On the date of discharge, his temperature was 98.6, heart rate 75 per minute, respirations 20 per minute, blood pressure 143/75 mmHg, and he was saturating 97% on room air.   CARDIOVASCULAR: S1 and S2 normal. No murmurs, rubs, or gallops.   PULMONARY: Lungs are clear to auscultation bilaterally. No wheezes, rales, rhonchi, or crepitation.   ABDOMEN: Soft, benign. He has bilateral nephrostomy tubes in place and draining well.  NEUROLOGIC: Nonfocal examination.  All other physical examination remained at baseline.   DISCHARGE MEDICATIONS:  1. Aspirin 81 mg p.o. daily.  2. Glimepiride 2 mg 1/2 tablet p.o. daily.  3. Metoprolol 25 mg p.o. daily.  4. Colace 100 mg p.o. twice a day as needed.   DISCHARGE DIET: Renal diet.   DISCHARGE ACTIVITY: As tolerated.   DISCHARGE INSTRUCTIONS AND FOLLOW-UP: The patient was instructed to follow-up with his primary care physician, Dr. Toy CookeyErnest Eason, in 1 to 2 weeks. He will need follow-up with Dr. Assunta GamblesBrian Cope from urology in 2 to 4 weeks as scheduled and with [facility] in 1 to 2 weeks. He was instructed to keep the nephrostomy tube in place and if it does come out call urology office immediately and/or come to the emergency department. He was set up to get home health with physical therapy, nursing, and nursing aide. His nephrostomy tube needs to stay in place until he has reevaluation by urology as an outpatient. He will need followup with Dr. Janese BanksSandeep Pandit from oncology in 1 week and will require CBC with basic metabolic  panel check in one week with results forwarded to primary care physician and nephrology.  TOTAL TIME DISCHARGING PATIENT: 55 minutes. ____________________________ Ellamae SiaVipul S. Sherryll BurgerShah, MD vss:slb D: 01/25/2012 15:58:05 ET T: 01/26/2012 10:24:19 ET JOB#: [id number]  cc: Kamela Blansett S. Sherryll BurgerShah, MD, <Dictator> Serita ShellerErnest B. Maryellen PileEason, MD Madolyn FriezeBrian S. Achilles Dunkope, MD Sandeep R. Sherrlyn HockPandit, MD Lennox PippinsMunsoor N. Lateef, MD [facility] Ellamae SiaVIPUL S [facility] MD ELECTRONICALLY SIGNED 01/26/2012 11:27

## 2014-07-21 NOTE — Consult Note (Signed)
Chief Complaint:   Subjective/Chief Complaint large bladder mass   VITAL SIGNS/ANCILLARY NOTES: **Vital Signs.:   19-Oct-13 08:51   Temperature Temperature (F) 99.5   Celsius 37.5   Temperature Source oral   Pulse Pulse 80   Respirations Respirations 18   Systolic BP Systolic BP 696   Diastolic BP (mmHg) Diastolic BP (mmHg) 69   Mean BP 94   Pulse Ox % Pulse Ox % 95   Pulse Ox Activity Level  At rest   Oxygen Delivery Room Air/ 21 %  *Intake and Output.:   Shift 19-Oct-13 15:00   Grand Totals Intake:  1184 Output:  600    Net:  584 24 Hr.:  584   Oral Intake      In:  360   IV (Primary)      In:  824   Other Output ml     Out:  400   Other Output ml     Out:  200   Length of Stay Totals Intake:  8436 Output:  78938    Net:  -2404   Brief Assessment:   Gastrointestinal details normal Soft  Nontender  Nondistended    Additional Physical Exam PCN- bilateral nephrostomy bags draining amber urine Foley- small amount of urine draining   Lab Results: Routine Chem:  18-Oct-13 06:56    Glucose, Serum  138   BUN  33   Creatinine (comp)  2.47   Sodium, Serum 145   Potassium, Serum 3.6   Chloride, Serum  108   CO2, Serum 28   Calcium (Total), Serum  7.8   Anion Gap 9   Osmolality (calc) 298   eGFR (African American)  29   eGFR (Non-African American)  25 (eGFR values <92m/min/1.73 m2 may be an indication of chronic kidney disease (CKD). Calculated eGFR is useful in patients with stable renal function. The eGFR calculation will not be reliable in acutely ill patients when serum creatinine is changing rapidly. It is not useful in  patients on dialysis. The eGFR calculation may not be applicable to patients at the low and high extremes of body sizes, pregnant women, and vegetarians.)  19-Oct-13 04:01    Glucose, Serum  126   BUN  27   Creatinine (comp)  2.24   Sodium, Serum  146   Potassium, Serum 3.9   Chloride, Serum  112   CO2, Serum 27   Calcium (Total),  Serum  7.7   Anion Gap 7   Osmolality (calc) 297   eGFR (African American)  33   eGFR (Non-African American)  28 (eGFR values <631mmin/1.73 m2 may be an indication of chronic kidney disease (CKD). Calculated eGFR is useful in patients with stable renal function. The eGFR calculation will not be reliable in acutely ill patients when serum creatinine is changing rapidly. It is not useful in  patients on dialysis. The eGFR calculation may not be applicable to patients at the low and high extremes of body sizes, pregnant women, and vegetarians.)  Routine Hem:  18-Oct-13 06:56    WBC (CBC) 5.4   RBC (CBC)  2.19   Hemoglobin (CBC)  6.9   Hematocrit (CBC)  20.4   Platelet Count (CBC) 169   MCV 93   MCH 31.5   MCHC 34.0   RDW 12.3   Neutrophil % 70.5   Lymphocyte % 14.4   Monocyte % 12.8   Eosinophil % 1.8   Basophil % 0.5   Neutrophil # 3.8  Lymphocyte #  0.8   Monocyte # 0.7   Eosinophil # 0.1   Basophil # 0.0 (Result(s) reported on 19 Jan 2012 at 07:31AM.)  19-Oct-13 04:01    WBC (CBC) 6.6   RBC (CBC)  2.18   Hemoglobin (CBC)  7.0   Hematocrit (CBC)  20.4   Platelet Count (CBC) 185   MCV 94   MCH 32.3   MCHC 34.4   RDW 12.3   Neutrophil % 76.6   Lymphocyte % 10.9   Monocyte % 10.1   Eosinophil % 2.2   Basophil % 0.2   Neutrophil # 5.0   Lymphocyte #  0.7   Monocyte # 0.7   Eosinophil # 0.1   Basophil # 0.0 (Result(s) reported on 20 Jan 2012 at 04:38AM.)   Assessment/Plan:  Assessment/Plan:   Assessment s/p TURBT for large bladder mass b/l PCN drainage    Plan continue current managment awaiting path for further recs   Electronic Signatures: Margy Clarks (MD)  (Signed 19-Oct-13 14:34)  Authored: Chief Complaint, VITAL SIGNS/ANCILLARY NOTES, Brief Assessment, Lab Results, Assessment/Plan   Last Updated: 19-Oct-13 14:34 by Margy Clarks (MD)

## 2014-07-21 NOTE — Op Note (Signed)
PATIENT NAME:  Karolee OhsCURRIE, Stanley G MR#:  [id number] DATE OF BIRTH:  Sep 03, 1940  DATE OF PROCEDURE:  01/14/2012  PREOPERATIVE DIAGNOSES:  1. Acute renal failure with hyperkalemia and severe hypertension.  2. Chronic kidney disease.  3. Hypertension.   POSTOPERATIVE DIAGNOSES:  1. Acute renal failure with hyperkalemia and severe hypertension.  2. Chronic kidney disease.  3. Hypertension.   PROCEDURES:  1. Ultrasound guidance for vascular access, right femoral vein.  2. Placement of right femoral Trialysis dialysis catheter.   SURGEON: Annice NeedyJason S. Maile Linford, M.D.   ANESTHESIA: Local.   ESTIMATED BLOOD LOSS: Minimal.   FLUOROSCOPY: None.  CONTRAST USED: None.  INDICATION FOR PROCEDURE: This is a 74 year old African American male with chronic kidney disease who is admitted with acute renal failure with potassium greater than 6 requiring urgent dialysis. Dialysis catheter is necessary.   DESCRIPTION OF PROCEDURE: The patient is laid flat in his critical care bed and the right groin was sterilely prepped and draped and a sterile surgical field was created. The right femoral vein was visualized with ultrasound and found to be widely patent. It was then accessed under direct ultrasound guidance without difficulty with a Seldinger needle. A J-wire was placed. After skin nick and dilatation, the Duo-Glide dialysis catheter was placed over the wire and the wire was removed. Both lumens withdrew dark red, nonpulsatile blood and flushed easily with sterile saline. It was secured to the skin with three nylon sutures at 29 cm. ____________________________ Annice NeedyJason S. Jacody Beneke, MD jsd:slb D: 01/14/2012 11:58:18 ET T: 01/14/2012 12:16:38 ET JOB#: [id number]  cc: Annice NeedyJason S. Yamaira Spinner, MD, <Dictator> Annice NeedyJASON S Daviel Allegretto MD ELECTRONICALLY SIGNED 01/15/2012 13:34

## 2014-07-21 NOTE — Consult Note (Signed)
Patient admitted with ARF on top of CKD.  K >6, severe hypertension.  For urgent HD todayplace temp cath in groin today  Electronic Signatures: Annice Needyew, Levii Hairfield S (MD)  (Signed on 13-Oct-13 10:19)  Authored  Last Updated: 13-Oct-13 10:19 by Annice Needyew, Jadaya Sommerfield S (MD)

## 2014-07-21 NOTE — Consult Note (Signed)
Information received from weekend call urologist.  The chart has been reviewed.  It appears the patient is pending [facility].  Given his situation, this appears appropriate.  I am not completely certain why nephrostomy tubes have not been placed [facility].  It appears he is being held n.p.o. potentially for this purpose.  There does not appear to be any plan however for this at this time.  If it is going to be several more days before transfer, percutaneous nephrostomy tube placement should be performed at [facility].  Dialysis will otherwise be able to manage with his fluids.  He will need further evaluation with cystoscopy.  I do not see where a PSA has been ordered.  This would be beneficial.  It will be elevated after catheter placement.  If his level is more than 100, this could be indicative of prostate cancer.  This would be the other potential source for his obstruction.  There does not appear to be any urgent need for urological intervention.  I will follow for now.  If there are any questions, please the free to contact me  Electronic Signatures: Smith Robertope, Lakeyta Vandenheuvel S (MD)  (Signed on 14-Oct-13 17:52)  Authored  Last Updated: 14-Oct-13 17:52 by Smith Robertope, Belisa Eichholz S (MD)

## 2014-07-21 NOTE — Consult Note (Signed)
PET scan IMPRESSION: No focal intensely PET positive lesions identified. There is diffuse in mild increased activity over the prostate and bladder. Previously identified bilateral hydronephrosis has improved. Small pleural effusions.discussion with pathologist Dr. Excell SeltzerBaker, further immunostains is being planned on cystoscopy biopsy specimen and final pathology report will not be back for a few days.  If patient is discharged from hospital, will contact him for outpatient followup.  Patient and wife present explained above details also.   Electronic Signatures: Izola PricePandit, Marit Goodwill Raj (MD)  (Signed on 22-Oct-13 16:54)  Authored  Last Updated: 22-Oct-13 16:54 by Izola PricePandit, Frederico Gerling Raj (MD)

## 2014-07-21 NOTE — Consult Note (Signed)
Urology Consultation Report: for Consultation: Bladder wall thickening with b/L Hydroureteronephrosis with Acute on CRI MD: Starleen Armsawood S. Elgergawy, M.D.MD: Marin OlpJay H. Zyaira Vejar, M.D. Toy CookeyErnest Eason, M.D. 74 y.o MAAM with NIDDM and CRI who [facility] early this morning with progressive lethargy. The pt was found to be hypoglycemic and improved with IV D50. The pt, however, was also found to be in ARF with a serum creatinine of 10.49 (baseline 2.5 in 11/2011). A bladder scan revealed a zero PVR with foley catheter placement confirming the absence of urinary retention with only 50mL obtained (UA with 13 rbc/hpf). A stone protocol CT of the Abd/Pelvis revealed moderate b/L hydroureteronephrosis to the level of the bladder with marked thickening of the bladder base/wall. Bilateral PCN placement was recommended given the extremely low likelihood of success with retrograde stent placement given the marked degree of bladder base distortion/thickening. Hx: Pt reports a 6 month h/o increasing urinary urgency without incontinence nor freq aside from NF x2-3. Pt denies dysuria, gross hematuria, F/C, or weight loss. Pt reports a 20 pk-yr smoking hx (1ppd x20 yrs) which was discontinued 34 yrs ago. Pt denies any past h/o urolithiasis or FH of Bladder or Prostate Cancer. He is not aware of any past PSA testing reviewed with the pt and as per the H&P by Dr. Randol KernElgergawy on 01/14/2012 T 98.22F (8:51am); (10am): P86, BP 180/82, RA sat: 99%WD, Thin AAM in NADWarm/Dry, no lesions about the head/neckNC/AT, Anicteric, EOMIno masses/bruitsCTA, nL Respiratory EffortRRR without M/G/R, 2+ Radial b/LSoft/Flat, NT/ND, no palpable masses/organomegalydeferred (pt sitting on a bedpan s/p rectal Kayexalate)1+ pitting edema to the knees b/LnL ROMnon-focalAxO x4; appropriate K+ 6.3, CO2 14, BUN 113, Cr 10.49, Ca++ 8.3, Alb 3.2, LFT's wnL; WBC 5.8k, Hct 22.9, Plt 197k; PT INR 15.2; UA: pH: 5.0, SG 1.009, 13 rbc/hpfCT Abd/Pelvis without Contrast:  moderate b/L hydro to the level of the bladder with marked thickening of the bladder wall, small Ca++ at the right bladder base (?UVJ stone vs ST Ca++)  b/L Hydro with marked thickening of the bladder base/wall, ARFthat locally advanced, invasive bladder cancer needs to be considered; other considerations would also include locally advanced prostate cancerureteral stent placement is not likely to be achievable nor effective in relief of the b/L obstruction given the marked distortion of the bladder base Microhematuriabe secondary to foley placement but is also consistent with the differential diagnoses as stated in #1  b/L PCN placement as soon as the pt is stable from a metabolic standpointDiagnostic Cystourethroscopy after stabilization of the pt's metabolic statusPSA - may be elevated due to the foley placement, however, a marked elevation would increase the likelihood of locally advanced prostate cancer Assunta GamblesBrian Cope will be assuming Urology Call tomorrow morning. you for the opportunity to participate in the care of this most pleasant gentleman.  Electronic Signatures: Marin OlpKim, Mitzie Marlar H (MD)  (Signed on 13-Oct-13 11:35)  Authored  Last Updated: 13-Oct-13 11:35 by Marin OlpKim, Jesusmanuel Erbes H (MD)

## 2014-07-21 NOTE — H&P (Signed)
PATIENT NAME:  [place], [place] Horn MR#:  [id number] DATE OF BIRTH:  Feb 27, 1941  DATE OF ADMISSION:  02/21/2012  PRIMARY CARE PHYSICIAN: Dr. Toy Cookey REFERRING PHYSICIAN: Dr. Bayard Males   CHIEF COMPLAINT: Fever status post recent chemotherapy.   HISTORY OF PRESENT ILLNESS: [place] is a 74 year old pleasant African American male with history of stage III carcinoma of the bladder who had renal failure and hydronephrosis, required bilateral nephrostomy tubes. He started chemotherapy with weekly carboplatin with Gemcitabine started on 02/06/2012. Patient is regularly followed up by Dr. Sherrlyn Hock and he had CBC done yesterday which showed neutropenia. He was placed on empiric antibiotic using ciprofloxacin and was notified that if he developed fever he has to seek medical advice or go to the hospital. Upon going home the patient did develop fever and chills therefore he appeared here. Work-up here is consistent with neutropenia and the presence of fever, however, source of infection likely is the urine. His chest x-ray is negative and he has no cough, no sputum production, no abdominal pain, no vomiting, no diarrhea and he has no urinary symptoms either.   REVIEW OF SYSTEMS: CONSTITUTIONAL: He had fever and chills. Denies fatigue, only mild. EYES: No blurring of vision. No double vision. ENT: No hearing impairment. No sore throat. No dysphagia. CARDIOVASCULAR: No chest pain. No shortness of breath. No palpitations. RESPIRATORY: No cough. No sputum production. No chest pain. No hemoptysis. GASTROINTESTINAL: No abdominal pain, no vomiting, no diarrhea. GENITOURINARY: No dysuria or frequency of urination. He makes little urine through the normal pathway, however, he has bilateral nephrostomy and connected these to urine bags on each leg. MUSCULOSKELETAL: No joint pain or swelling. No muscular pain or swelling. INTEGUMENTARY: No skin rash. No ulcers. NEUROLOGY: No focal weakness. No seizure activity. No  headache. PSYCHIATRY: No anxiety. No depression. ENDOCRINE: No polyuria or polydipsia. No heat or cold intolerance. HEMATOLOGY: No easy bruisability. No lymph nodes enlargement.   PAST MEDICAL HISTORY:  1. 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 2. Bilateral hydronephrosis status post nephrostomy tube placement.  3. Chronic renal failure stage III  4. Systemic hypertension.  5. Diabetes mellitus, type 2.   PAST SURGICAL HISTORY:  1. Cystoscopy and biopsy from bladder tumor.  2. Bilateral nephrostomy tube placement.  3. Cataract surgery.   FAMILY HISTORY: Negative for cancer. His father died at age of 60. He does not know the reason of death. His mother died at age of 43 of natural causes.   SOCIAL HABITS: Nonsmoker. No history of alcohol or drug abuse.   SOCIAL HISTORY: He is married, living with his wife. He has 10 children, five girls and five boys. He retired from working at [employer]. His job was sitting up tables and chairs.   ADMISSION MEDICATIONS:  1. Metoprolol succinate 25 mg once a day. 2. Glimepiride 2 mg taking 1/2 tablet a day, that is 1 mg a day.  3. Ferrous sulfate 325 mg daily.  4. Aspirin 81 mg a day.  5. He was just started on ciprofloxacin 250 mg twice a day.   ALLERGIES: No known drug allergies.   PHYSICAL EXAMINATION:  VITAL SIGNS: Blood pressure 168/78, respiratory rate 18, pulse 92, temperature 99.8, pulse oximetry 98%.   GENERAL APPEARANCE: Elderly male laying in bed in no acute distress. He is thin looking but healthy, in no acute distress.   HEAD AND NECK: Examination revealed mild pallor. No icterus.  No cyanosis.   HEENT: Ear examination revealed normal hearing. No ear discharge or bleeding or any lesions. Nasal mucosa was normal without lesions. No discharge. No ulcers. Oropharyngeal area showed no mouth ulcers. No exudates. No oral thrush. He  is edentulous and is wearing dentures. Eye examination revealed normal eyelids and conjunctivae. Pupils about 4 mm, equal, round and reactive to light.   NECK: Supple. Trachea at midline. No thyromegaly. No cervical masses. No lymphadenopathies.   HEART: Normal S1, S2. No S3, S4. No murmur. No gallop. No carotid bruits.   RESPIRATORY: Normal breathing pattern without use of accessory muscles. No rales. No wheezing.   ABDOMEN: Soft without tenderness. No hepatosplenomegaly. No masses. No hernias.   SKIN: No ulcers. No subcutaneous nodules.   MUSCULOSKELETAL: No joint swelling. No clubbing. He has peripheral edema, +3, in both lower extremities.   NEUROLOGY: Cranial nerves II through XII are intact. No focal motor deficits. Sensation is intact.   PSYCHIATRY: Patient is alert, oriented x3. Mood and affect were normal.   LABORATORY, DIAGNOSTIC, AND RADIOLOGICAL DATA: Chest x-ray showed no consolidation, no effusion. Heart size was normal. Serum glucose 124, BUN 47, creatinine 2.07. Sodium 138, potassium 4.7, estimated GFR 36. Calcium 8.2 with albumin of 2.9. Normal liver transaminases and bilirubin. CBC showed white count of 0.6 with hemoglobin of 7.1 and hematocrit of 20. Platelet count is 30. Normal MCV, MCH and MCHC. Differential of white blood cells showed 47% neutrophils, 46% lymphocytes and 4% monocytes. Urinalysis showed turbid urine, +3 blood, 60 red blood cells, 13 white blood cells. Yesterday he had urinalysis which showed 77 white blood cells, +3 bacteria.   ASSESSMENT:  1. Neutropenia, status post chemotherapy associated with fever and chills.  2. Fever and chills. The culprit is neutropenia and possible urinary tract infection. 3. Stage III bladder cancer. 4. Normochromic normocytic anemia. 5. Severe thrombocytopenia.  6. Diabetes mellitus, type 2.  7. Systemic hypertension.  8. Chronic kidney disease stage III. 9. Bilateral hydronephrosis. He is status post bilateral  nephrostomy tube placement.   PLAN: Will admit the patient to the oncology floor, place her on neutropenic precautions. Blood cultures x2 and urine culture were taken. Empiric IV antibiotic using Cefepime 1 gram q.8 hours pending results of the cultures. Continue the home medications as listed above. Consultation Dr. Sherrlyn HockPandit. I spoke with the patient regarding LIVING WILL. He has no LIVING WILL, however, for his CODE STATUS initially he told me that he does not want resuscitation or life support, however, his wife was in the room and she intervened then at the end he changed his mind and for the time being he wants to be FULL CODE. I answered their questions. I reviewed his medical records.   TIME SPENT EVALUATING THIS PATIENT: Took more than 55 minutes.   ____________________________ Carney CornersAmir M. Rudene Rearwish, MD amd:cms D: 02/21/2012 05:47:37 ET T: 02/21/2012 09:34:00 ET JOB#: [id number]  cc: Carney CornersAmir M. Rudene Rearwish, MD, <Dictator> Serita ShellerErnest B. Maryellen PileEason, MD  Carney CornersAMIR M Dimples Probus MD ELECTRONICALLY SIGNED 02/22/2012 0:28

## 2014-07-21 NOTE — Consult Note (Signed)
PATIENT NAME:  [place], William Horn MR#:  [id number] DATE OF BIRTH:  1940-07-15  DATE OF CONSULTATION:  01/14/2012  REFERRING PHYSICIAN:  Huey Bienenstockawood Elgergawy, MD CONSULTING PHYSICIAN:  Oval Cavazos Lizabeth LeydenN. Quintasia Theroux, MD  REASON FOR CONSULTATION: Acute renal failure, hyperkalemia, metabolic acidosis, and bilateral hydronephrosis.   HISTORY OF PRESENT ILLNESS: The patient is a 74 year old Caucasian male with past medical history of diabetes mellitus, chronic kidney disease stage IV, hypertension, who presented to [facility] with reported hypoglycemia. The patient's wife states that he had decreased responsiveness and was found to have a sugar of 42. He was subsequently brought to [facility] for further evaluation. The patient was noted to be on glyburide which can precipitate hypoglycemia, particularly with renal failure. The patient's initial blood glucose was 62. The patient is now also found to have severe acute renal failure. It is reported to us that creatinine was 2.5 in August of this year. However, I do not have his lab to review at present. He apparently is [facility] for chronic kidney disease. At present, the patient's BUN is 116 with a creatinine of 10.45. He also has severe hyperkalemia with a potassium of 6.1. He also has associated metabolic acidosis with serum bicarbonate of 14. He has been administered Kayexalate. He had a CT scan of the abdomen and pelvis which demonstrated bilateral hydronephrosis along with bladder wall thickening suspicious for malignancy. There was also thickening in the rectal area. It was recommended that he obtain nephrostomy tubes; however, these are not immediately available here at this time. Urology has been consulted. We have been asked to evaluate the patient for dialysis. He has been accepted for [facility]. However, a bed is not yet immediately available. We have also consulted vascular surgery at this time.    PAST MEDICAL HISTORY:  1. Diabetes mellitus.  2. Hypertension.  3. Chronic kidney disease, stage IV with baseline creatinine 2.5 followed [facility].   PAST SURGICAL HISTORY: Cataract surgery.   ALLERGIES: No known drug allergies.   CURRENT INPATIENT MEDICATIONS:  1. D10 at 75 mL per hour.  2. Norco 5/325, one to two tablets p.o. every four hours p.r.n.  3. Amlodipine 10 mg daily.  4. Hydralazine 5 mg IV q.4 hours p.r.n. systolic blood pressure greater than 175.  5. Zofran 4 mg IV every four hours p.r.n.  6. Protonix 40 mg p.o. q.6 a.m.   SOCIAL HISTORY: The patient lives in [place]. He is married. He denies tobacco, alcohol, or illicit drug use. 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   FAMILY HISTORY: No reported end-stage renal disease in the family.   REVIEW OF SYSTEMS: CONSTITUTIONAL: Denies fevers, chills, unsure of weight loss. EYES: Denies diplopia, blurry vision. HEENT: Denies headaches, hearing loss. Denies epistaxis or sore throat. CARDIOVASCULAR: Denies chest pain, palpitations, or PND. RESPIRATORY: Denies cough, shortness of breath, or hemoptysis. GASTROINTESTINAL: Denies nausea, vomiting, anorexia. GENITOURINARY: Denies frequency, urgency, also denies gross hematuria other than after Foley catheter was placed here. MUSCULOSKELETAL: Denies joint pain, swelling, or redness. INTEGUMENTARY: Denies skin rashes or lesions. NEUROLOGIC: Denies focal extremity numbness, weakness, or tingling. PSYCHIATRIC: Denies depression, bipolar disorder. ENDOCRINE: Denies polyuria or polydipsia. Does have history of diabetes mellitus and had hypoglycemia. HEMATOLOGIC/LYMPHATIC: Denies easy bruisability, bleeding, or swollen lymph nodes. ALLERGY/IMMUNOLOGIC: Denies seasonal allergies or history of immunodeficiency.   PHYSICAL EXAMINATION:  VITAL SIGNS: Temperature 98.4, pulse 86, respirations 17, blood pressure 180/82.  GENERAL: Well-developed,  well-nourished African American male who appears stated age, currently in no acute distress.   HEENT: Normocephalic, atraumatic. Extraocular movements are intact. Pupils equal, round, and reactive to light. No scleral icterus. Conjunctivae are pale. No epistaxis noted. Gross hearing intact. Oral mucosa moist. Dentures noted.   NECK: Supple without JVD or lymphadenopathy.   LUNGS: Clear to auscultation bilaterally with normal respiratory effort.   HEART: S1 and S2, regular rate and rhythm. No appreciable rubs.   ABDOMEN: Soft, nontender, nondistended. Bowel sounds positive. No rebound or guarding. No gross organomegaly appreciated.   EXTREMITIES: No clubbing, cyanosis, or edema.   NEUROLOGIC: The patient is alert and oriented to time, person, and place. Strength is five out of five in both upper and lower extremities.   MUSCULOSKELETAL: No joint redness, swelling or tenderness appreciated.   SKIN: Warm and dry. No rashes noted.   GU: Currently, no suprapubic tenderness appreciated. At this time Foley catheter in place. Significant amount of hematuria noted in the tubing.   PSYCHIATRIC: The patient is with appropriate affect and appears to have good insight into his current illness.   LABORATORY DATA: Sodium 139, potassium 6.3, chloride 113, CO2 14, BUN 113, creatinine 10.4. BNP 1,498. Glucose 66, calcium 8.3, magnesium 2.6, total protein 6.7, albumin 3.2, total bilirubin 0.3, alkaline phosphatase 64, AST 18, ALT 20. CBC shows WBC 5.8, hemoglobin 7.8, hematocrit 22, platelets 197. Urinalysis shows urine protein of 100 mg/dL, 13 RBCs per high-power field. No WBCs per high-power field. CT scan of the abdomen and pelvis demonstrates moderate bilateral hydronephrosis and ureterectasis. There is severe tissue thickening of the bladder which is concerning for malignancy. There is also a 4 mm calculus on the posterior aspect of the right side of the bladder which could represent a calculus within the  right ureterovesical junction. There was also circumferential thickening of the rectal wall.   IMPRESSION: This is a 74 year old African American male with past medical history of hypertension, diabetes mellitus, chronic kidney disease, stage IV with baseline creatinine 2.5 in August of this past year followed by [facility] who presented to [facility] today with hypoglycemia and found to have acute renal failure, hyperkalemia, metabolic acidosis, bilateral hydronephrosis, possible bladder mass versus bladder wall thickening.   PROBLEM LIST:  1. Severe acute renal failure.  2. Hyperkalemia.  3. Metabolic acidosis.  4. Bilateral hydronephrosis.  5. Possible right ureterovesical junction stone.  6. Bladder wall thickening versus bladder tumor.  7. Rectal wall thickening.   PLAN: The patient presents with severe metabolic derangements at this time. He initially presented with hypoglycemia, which is likely precipitated by glimepiride as well as acute renal failure. It appears that there is some history of underlying chronic kidney disease as well with reported baseline of 2.5 to [place]. However, I am unable to confirm this independently. However, the patient has been following with [facility] as an outpatient. Ultimately, he will need nephrostomy tube placement in the short term to help alleviate his acute renal failure. However, at this time nephrostomy placement is not immediately available. Therefore, we elect to treat the patient with hemodialysis to help lower potassium and to correct underlying metabolic acidosis. I have discussed this in depth with the patient as well as with the family and they are agreeable to proceeding with dialysis. We have consulted Dr. Wyn Quaker who will be placing a temporary hemodialysis catheter shortly. We have called in dialysis staff to help perform dialysis today. He has been accepted  for transfer. However, bed is not yet immediately available. We  will continue to monitor the patient's progress along with you carefully at this time.   I would like to thank Dr. Randol Kern for this kind referral.    ____________________________ Lennox Pippins, MD mnl:ap D: 01/14/2012 11:19:05 ET T: 01/14/2012 12:37:59 ET JOB#: [id number]  cc: Lennox Pippins, MD, <Dictator> Ria Comment Elius Etheredge MD ELECTRONICALLY SIGNED 02/13/2012 10:43

## 2014-07-21 NOTE — Consult Note (Signed)
Pt doing much betterUOPimprovment in renal function after PCN placementthe improvment, we will proceed with cystoscopy, bladder biopsy.with patientand benefits discussed.per earlier note, less likley prostate given the low PSA.scheduled for tomorrow around noon for cyatoscopy bladder biopsyconsented.agerres to proceed.enter pre-op orders.after MN  Electronic Signatures: Smith Robertope, Marlin Brys S (MD)  (Signed on 17-Oct-13 17:04)  Authored  Last Updated: 17-Oct-13 17:04 by Smith Robertope, Stevi Hollinshead S (MD)

## 2014-07-21 NOTE — Discharge Summary (Signed)
PATIENT NAME:  [place], [place] Horn MR#:  [id number] DATE OF BIRTH:  1940/05/23  DATE OF ADMISSION:  02/21/2012 DATE OF DISCHARGE:  02/26/2012  PRIMARY CARE PHYSICIAN: Dr. Maryellen PileEason  PRIMARY ONCOLOGIST: Dr. Sherrlyn HockPandit   CONSULTANTS:  1. Dr. Sampson GoonFitzgerald, infectious disease. 2. Dr. Lady GaryFath, cardiology. 3. Dr. Sherrlyn HockPandit, oncology.   CHIEF COMPLAINT: Fever.   DISCHARGE DIAGNOSES:  1. Sepsis.  2. Enterococcus bacteremia.  3. Urinary tract infection with enterococcus and Pseudomonas, likely from chronic nephrostomy tube.  4. Chronic kidney disease stage III.  5. Bladder cancer on chemotherapy.  6. Hypertension.  7. Acute on chronic anemia.  8. Thrombocytopenia.  9. Neutra-Phos fever.  10. History of bilateral hydronephrosis status post bilateral nephrostomy tubes.  11. Diabetes.  12. Hypertension.   DISCHARGE MEDICATIONS:  1. Cipro 250 mg every 12 hours for 10 days or until seen by Dr. Sampson GoonFitzgerald.  2. Amoxicillin 500 mg 3 times a day until seen by Dr. Sampson GoonFitzgerald in about 10 days.  3. Ferrous sulfate 325 mg, 1 tab daily.  4. Metoprolol succinate 25 mg once a day. 5. Glimepiride 2-mg tab, half a tab once a day. 6. Aspirin 81 mg daily.   DIET: Low sodium, ADA diet.   ACTIVITY: As tolerated.   DISCHARGE FOLLOWUP:  1. Please follow with your primary care physician within 1 to 2 weeks.  2. Please follow with Dr. Sampson GoonFitzgerald from infectious disease. He will call you with an appointment within 10 days.  3. Please follow with oncologist as previously scheduled.   DISPOSITION: Home.   HISTORY OF PRESENT ILLNESS: For full details of the History and Physical, please see the dictation on 02/21/2012 by Dr. Rudene Rearwish. Briefly, this is a pleasant 74 year old African American male with history of stage III carcinoma of the bladder with renal failure, chronic kidney disease stage III, and hydronephrosis requiring bilateral nephrostomy tubes who presented for fever. He was being followed by Dr. Sherrlyn HockPandit and had CBC  done which showed neutropenia. He was started on Cipro as he did have a fever. The patient was instructed that if he did have a fever given  neutropenia please go to the ER. He was admitted to the hospitalist service. The patient did have fevers and chills.   SIGNIFICANT LABS AND IMAGING: Blood cultures on arrival, two out of two bottles showing Enterococcus. Urine cultures on arrival showing enterococcus as well as Pseudomonas in urine.  Repeat blood cultures on 11/22: No growth to date. Initial creatinine 2.11. By discharge it was 1.88. WBC on arrival 0.9, lowest WBC was 0.6. Initial hemoglobin 7.6. Lowest hemoglobin 6.3. Last hemoglobin 7.6. Initial platelets 36, lowest platelets were 17, and last platelets 146 as of 11/24.  Initial echocardiogram done on 11/23 showing normal ejection fraction. Right ventricular systolic function is normal. Vegetation on the mitral valve cannot be excluded.  There is small vegetation or mass on the mitral valve possibly on the mitral valve leaflets. No pericardial effusion. There is no pleural effusion. A TEE done on 11/25 per Dr. America BrownFath's note: No evidence of endocarditis, aortic, mitral, tricuspid valve abnormality or vegetations. X-ray of the chest, one view on 11/20: No acute cardiopulmonary disease.   HOSPITAL COURSE: The patient was admitted to the hospitalist service. He was started on broad-spectrum antibiotics and blood and urine cultures were obtained. Blood cultures came back positive for enterococcus. The patient was neutropenic. Infectious disease was consulted as well as Dr. Sherrlyn HockPandit from oncology. The patient had a recent a round of chemotherapy, therefore the pancytopenia.  The patient also does have a history of chronic anemia and chronic kidney disease. The patient did have a dose of Neupogen. His CBC did trend up. His fevers did resolve. Cultures were repeated which were negative. As part of the work-up an echocardiogram was obtained. Initial echocardiogram done  on 11/23 showed possibility of mitral valve vegetation, although the likelihood was small. Nonetheless, he was started on gentamicin in addition to ampicillin, which was started per ID after discontinuation of the broad-spectrum antibiotics after the enterococcus was shown to be susceptible to ampicillin.  He was on ampicillin for a couple of days until a more definitive TEE was done today, which proved that he did not have any vegetations. At this point he will be discharged with amoxicillin for 10 days. I discussed the case with Dr. Sampson Goon today who has been following the patient. He is going to give the patient an appointment within the next 10 days. The patient was instructed to take amoxicillin as well as Cipro which was started here for Pseudomonas urinary tract infection until he sees Dr. Sherrlyn Hock within 10 days. He did have a drop in his hemoglobin and was transfused a unit. This was likely in the setting of chemotherapy and acute on chronic normocytic normochromic anemia of chronic disease. His chronic kidney disease remains stable. He has been afebrile and his neutropenia and fevers have resolved. At this point, he will be discharged with outpatient follow-up with his primary care physician Dr. Sherrlyn Hock as well as Dr. Sampson Goon as dictated above. There is a question of whether he should be on chronic suppressive therapy as he does have bilateral nephrostomy tubes which makes him more susceptible to having ongoing infections and therefore he will be followed with infectious disease as an outpatient.  TOTAL TIME SPENT: 35 minutes.     ____________________________ Krystal Eaton, MD sa:bjt D: 02/26/2012 15:17:45 ET T: 02/26/2012 15:42:27 ET JOB#: [id number]  cc: Krystal Eaton, MD, <Dictator> Serita Sheller. Maryellen Pile, MD Stann Mainland. Sampson Goon, MD Sandeep R. Sherrlyn Hock, MD Krystal Eaton MD ELECTRONICALLY SIGNED 03/14/2012 11:10

## 2014-07-21 NOTE — Consult Note (Signed)
Large solid tumor over entire bladder base.or orifices not visible.area over bladder base resected for tissue.muscle not identifiable consistent with probable invasion.poorly differentiated aggressive tumor.follow.  Further recs pending pathology.  Electronic Signatures: Smith Robertope, Ira Busbin S (MD)  (Signed on 18-Oct-13 12:51)  Authored  Last Updated: 18-Oct-13 12:51 by Smith Robertope, Kashayla Ungerer S (MD)

## 2014-07-21 NOTE — H&P (Signed)
PATIENT NAME:  WILBUR, OAKLAND MR#:  [id number] DATE OF BIRTH:  [birth year]  DATE OF ADMISSION:  01/14/2012  REFERRING PHYSICIAN: Brandt Loosen, MD  PRIMARY CARE PHYSICIAN: Toy Cookey, MD  NEPHROLOGIST: The patient is following with [facility].   CHIEF COMPLAINT: Lethargy.   HISTORY OF PRESENT ILLNESS: This is a 74 year old male with significant past medical history of diabetes mellitus, hypertension, and chronic kidney disease who presents with lethargy. The patient was brought by his wife because she reports he has been more lethargic and exhausted than his baseline. The patient was found to have fingersticks of 52 where he much improved after receiving D50. The patient is known to have history of diabetes, on glimepiride as an outpatient, been followed by [facility] where his metformin was recently discontinued for worsening renal function. The patient was found as well to have worsening renal failure. The patient's last known baseline creatinine was 2.5 in August of this year; it was 10.45 today. The patient denies any new medications. As well, he denies any urinary retention or urinary complaints, no dysuria, and no pleural polyuria. He reports he is having regular urine output. As well the patient had a Foley inserted with only 50 mL urine coming out. As well he had zero residual volume bladder scan. As well the patient was found to have hyperkalemia with potassium of 6.1. He has no EKG changes. He denies any palpitations or chest pain or shortness of breath. The patient's hemoglobin was found to be 8.4; last hemoglobin was 9.5 in August of this year at [facility]. The patient was Hemoccult-negative in the emergency department. He denies any melena, coffee-ground emesis, or bright red blood per rectum. On rectal examination, the patient had normal color stools and no blood. As well the patient had significant lower extremity edema. The patient reported this has been  chronic over few months, but he reports it had progressed recently. As well the patient was extremely hypertensive upon presentation with systolic blood pressure around 220, which much improved after giving 20 mg of IV labetalol, where repeated blood pressure was 179/84. The patient denies any headache, blurry vision, chest pain, or shortness of breath.   PAST MEDICAL HISTORY:  1. Diabetes mellitus.  2. Chronic kidney disease, last creatinine 2.5 in August.  3. Hypertension. Used to be on metoprolol but was discontinued two years ago for low blood pressure.   PAST SURGICAL HISTORY: Cataract surgery.   SOCIAL HISTORY: No tobacco, alcohol, or drug use. Works at the [employer].   FAMILY HISTORY: No history of hypertension, diabetes, or coronary artery disease.   ALLERGIES: No known drug allergies.   HOME MEDICATIONS:  1. Glimepiride 2 mg oral half tablet daily.  2. Aspirin 81 mg oral daily   REVIEW OF SYSTEMS: CONSTITUTIONAL: The patient denies any fever. Complains of general fatigue and weakness. EYES: Denies blurry vision, double vision, or pain. ENT: Denies tinnitus, ear pain, or hearing loss. RESPIRATORY: Denies cough, wheezing, hemoptysis, dyspnea, or asthma. CARDIOVASCULAR: Denies chest pain, orthopnea, arrhythmia, palpitations, or syncope. Has lower extremity edema. GASTROINTESTINAL: Denies nausea, vomiting, diarrhea, abdominal pain, or hematemesis. GENITOURINARY: Denies dysuria, hematuria, renal colic, or frequency. ENDOCRINE: Denies polyuria, polydipsia, heat or cold intolerance. INTEGUMENTARY: Denies any acne or rash. Has lower extremity left foot heel ulcer, following with podiatry. MUSCULOSKELETAL: Denies neck pain, shoulder pain, knee pain, gout, or redness. Has lower extremity swelling. NEURO: Denies numbness, weakness, dysarthria, epilepsy, tremors, vertigo, or ataxia. PSYCHIATRIC: Denies anxiety, insomnia, nervousness,  schizophrenia, or bipolar disorder.   PHYSICAL  EXAMINATION:   VITAL SIGNS: Temperature 98.6, pulse 76, respiratory rate 16, blood pressure 183/84, and saturating 99% on room air.   GENERAL: Frail, elderly male who is comfortable and in no apparent distress.   HEENT: Head atraumatic, normocephalic. Pupils are equal and reactive to light. Pink conjunctivae. Anicteric sclerae. Moist oral mucosa.   NECK: Supple. No thyromegaly. No JVD.   PULMONARY: Good air entry bilaterally. No wheezing, rales, or rhonchi.   CARDIOVASCULAR: S1 and S2 heard. No rubs, murmurs, or gallops.   ABDOMEN: Soft, nontender, and nondistended. Bowel sounds present.   EXTREMITIES: Bilateral lower extremity edema, +3 bilaterally.   PSYCHIATRIC: Appropriate affect. Awake and alert x3. Intact judgment and insight.   NEURO: Cranial nerves grossly intact. No focal neurological deficits. Motor 5/5. Sensation symmetrical and intact.   SKIN: Has chronic lower extremity discoloration and left heel ulcer, healed nicely. No discharge or bleed.   PERTINENT LABS: Glucose 62, iron 60, BUN 116, creatinine 10.45, sodium 142, potassium 6.1, chloride 116, and CO2 13. White blood cell 5.6, hemoglobin 8.4, hematocrit 24.6, and platelets 204.   ASSESSMENT AND PLAN:  1. Hypoglycemia. The patient is known to have history of diabetes mellitus, on glimepiride, and is having worsening renal failure. This is most likely related to oral hyperglycemic agent in his system. The patient will be started on hypoglycemia protocol. We will discontinue all oral hypoglycemic agents and will be started on D5 half-normal saline. I will continue with Accu-Cheks every four hours.  2. Acute on chronic respiratory failure. The patient's last creatinine was 2.5 in August and currently is 10.5. The patient had a CT of the abdomen and pelvis ordered. It was done and awaiting on the results, but there does not appear to be any urinary retention as he had a Foley catheter inserted with only 50 mL urine output and  bladder scan is showing zero residual volume. We will check a renal ultrasound. As well we will check urinalysis, urine culture, urine eosinophils, and sodium,. and we will repeat renal ultrasound and will follow on the CT abdomen and pelvis results to evaluate if there is any hydronephrosis.   3. Diabetes mellitus. We will continue with Accu-Cheks. We will check glycohemoglobin. We will hold glimepiride secondary to diagnosis #1.  4. Hyperkalemia. This is due to worsening renal failure. We will give Kayexalate and calcium gluconate. We will avoid any IV insulin at this point secondary to hypoglycemia.  5. Anemia, appears to be chronic, and is most likely secondary to anemia of chronic kidney disease. The patient is Hemoccult-negative, but we will continue to hold aspirin. We will check anemia work-up.  6. Hypertensive urgency. The patient's blood pressure was significantly elevated on presentation.  We will start him on Norvasc and p.r.n. hydralazine.   CODE STATUS: THE PATIENT IS FULL CODE.   TOTAL TIME SPENT ON PATIENT CARE AND ADMISSION: 55 minutes. ____________________________ Starleen Armsawood S. Gissela Bloch, MD dse:slb D: 01/14/2012 03:10:40 ET T: 01/14/2012 09:08:18 ET JOB#: [id number]  cc: Starleen Armsawood S. Kalah Pflum, MD, <Dictator> Serita ShellerErnest B. Maryellen PileEason, MD Cheila Wickstrom Teena IraniS Macedonio Scallon MD ELECTRONICALLY SIGNED 01/15/2012 8:22

## 2014-07-21 NOTE — Consult Note (Signed)
PATIENT NAME:  William Horn, Adarryl G MR#:  [id number] DATE OF BIRTH:  03-Oct-1940  DATE OF CONSULTATION:  02/21/2012  REFERRING PHYSICIAN:  Dr. Rudene Rearwish  CONSULTING PHYSICIAN:  Leonidus Rowand R. Sherrlyn HockPandit, MD  REASON FOR CONSULTATION: Fever and neutropenia.   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 The patient had CBC done as outpatient monitoring and was found to be neutropenic. He was prescribed ciprofloxacin for prophylaxis of febrile neutropenia. The patient, however, had fever of greater than 100.5 yesterday and was feeling weaker and sicker and has been hospitalized. He is on broad-spectrum antibiotic coverage with cefepime, states that he is beginning to feel slightly better. Eating better today. No chills at this time. Denies any new dysuria or change in color of urine from nephrostomy tubes. Has mild cough. No sputum, chest pain or hemoptysis. Denies any loss of consciousness or confusion.   PAST MEDICAL HISTORY/PAST SURGICAL HISTORY:  1. Bladder cancer as described above.  2. Diabetes mellitus.  3. Chronic kidney disease, creatinine 2.5 in August.  4. Hypertension.  5. Cataract surgery.  6. Nephrostomy tubes in October 2013 for bilateral hydronephrosis causing severe acute renal failure.   FAMILY HISTORY: Noncontributory.   SOCIAL HISTORY: Denies smoking, alcohol, or drug usage. Lives with his wife.   ALLERGIES: No known drug allergies.   HOME MEDICATIONS:  1. Metoprolol 25 mg daily.  2. Docusate 100 mg b.i.d. p.r.n. constipation.  3. Ferrous sulfate 325 mg daily.  4. Aspirin 81 mg daily.  5. Glimepiride 2 mg half tablet once daily.   REVIEW OF SYSTEMS: CONSTITUTIONAL: As in history of present illness. Currently feeling  slightly better but still weak. No chills. HEENT: Denies headaches, dizziness at rest. No epistaxis, ear or jaw pain. CARDIAC: No angina, palpitation, orthopnea. LUNGS: As in history of present illness. GI: No nausea or vomiting. No diarrhea or blood in stools. GU: As in history of present illness. SKIN: No new rashes or pruritus. HEMATOLOGIC: No obvious bleeding symptoms. MUSCULOSKELETAL: No new bone pains. EXTREMITIES: No new swelling or pain. SKIN: No new rashes or pruritus. NEUROLOGIC: Denies any new focal weakness, loss of consciousness, or seizures.   PHYSICAL EXAMINATION:   GENERAL: The patient is sitting in bed, tired looking, otherwise alert and oriented x4 and converses appropriately. No icterus. Pallor present.   VITAL SIGNS: Temperature 99.5, pulse 82, respirations 18, blood pressure 151/81, 98% on room air.   HEENT: Normocephalic, atraumatic. Extraocular movements intact. Sclerae anicteric. Mouth is slightly dry.   NECK: Negative for lymphadenopathy.   CARDIOVASCULAR: S1, S2, regular rate and rhythm.   LUNGS: Lungs show bilateral good air entry, no rhonchi.   ABDOMEN: Soft, nontender. No guarding or rigidity. No hepatosplenomegaly clinically.   EXTREMITIES: No major edema or cyanosis.   SKIN: No new rashes or bruising.   NEUROLOGIC: Limited examination. Cranial nerves intact. Moves all extremities spontaneously.   MUSCULOSKELETAL: No obvious joint deformity or swelling.   LABORATORY, DIAGNOSTIC, AND RADIOLOGICAL DATA: WBC 600, ANC 300, hemoglobin 7.1, platelets 30, creatinine 2.07, BUN 47, potassium 4.7, calcium 8.2. Liver functions unremarkable except low albumin of 2.9. Blood cultures negative so far, preliminary report. Urinalysis shows 3+ blood, 2+ leukocyte esterase.   IMPRESSION AND RECOMMENDATION: This is a 74 year old gentleman with known history of locally advanced high-grade bladder cancer started  on weekly chemotherapy with gemcitabine and carboplatin, received  second dose on November 12th. Currently admitted with febrile neutropenia with ANC of 300. Also, hemoglobin 7.1 and platelets 30,000 with no obvious bleeding symptoms. Agree with ongoing broad-spectrum antibiotic coverage with cefepime. Continue to follow blood cultures and adjust antibiotics based upon report if it returns positive. Otherwise, agree with ongoing supportive treatment. Neutropenic isolation. Given febrile neutropenia and ongoing weakness, will also start her on Neupogen 480 mcg sub-Q once daily and monitor WBC/ANC counts. The patient otherwise is beginning to feel better at this time. Will continue to follow.   Thank you for the referral. Please feel free to contact me if additional questions.  ____________________________ Maren Reamer Sherrlyn Hock, MD srp:drc D: 02/21/2012 23:45:34 ET T: 02/22/2012 06:45:34 ET JOB#: [id number]  cc: Darryll Capers R. Sherrlyn Hock, MD, <Dictator> Wille Celeste MD ELECTRONICALLY SIGNED 02/22/2012 15:49

## 2014-07-21 NOTE — Consult Note (Signed)
Brief Consult Note: Diagnosis: Enterococcal bacteremia and UTI assoc with nephrostomy tubes.   Patient was seen by consultant.   Consult note dictated.   Recommend further assessment or treatment.   Discussed with Attending MD.   Comments: Dc vanco and meropenem - start ampicillin 3gm q 6 hrs - will ask pharm to follow to adjust if renal fxn changes - repeat bcx to ensure clearance - check echo but given acuity doubt endocarditis - cont iv abx until f/u bcx neg can likely treat through the weekend and dc on oral abx monday if continues to improve. I will see again Monday THank you for the consult.  Electronic Signatures: Dierdre HarnessFitzgerald, Ashiah Karpowicz Patrick (MD)  (Signed ([phone]-Nov-13 16:01)  Authored: Brief Consult Note   Last Updated: 22-Nov-13 16:01 by Dierdre HarnessFitzgerald, Sourish Allender Patrick (MD)

## 2014-07-21 NOTE — Discharge Summary (Signed)
PATIENT NAME:  William Horn, William Horn MR#:  [id number] DATE OF BIRTH:  1940-05-08  DATE OF ADMISSION:  01/14/2012 DATE OF DISCHARGE:  01/14/2012  TRANSFER SUMMARY   ACCEPTING PHYSICIAN: Dr. Barbee CoughYankavskav, [facility].    CONSULTANTS:  1. Dr. Selena BattenKim, urology, over the phone.  2. Dr. Charlies SilversSuber interventional radiology, over the phone.  3. Dr. Mady HaagensenMunsoor Lateef.  4. Dr. Wyn Quakerew, vascular surgery.   DISCHARGE DIAGNOSES:  1. Bilateral hydronephrosis with obstructive uropathy secondary to bladder mass on computerized tomography of abdomen and pelvis.  2. Acute on chronic renal failure with baseline creatinine 2.5, currently 10.5.  3. Hyperkalemia. Not improving with medical management.  4. Hypertension.  5. Diabetes mellitus.  6. Hypoglycemia, most likely due to acute renal failure, on per mouth hypoglycemic agents.  7. Anemia, chronic, Hemoccult negative.  8. Hypermagnesemia.   HISTORY OF PRESENT ILLNESS: This is a 74 year old male with significant past medical history of diabetes and hypertension who presents with complaints of weakness, lethargy, was found to be hypoglycemic, and in acute renal failure. The patient was on glimepiride for his diabetes mellitus, with fingersticks in the low 50s, improved with D50 pushes, requiring currently D10W at 75 mL per hour to maintain his blood sugar in the 60s range. The patient was found to be in acute renal failure with baseline creatinine 2.5, currently 10.5 and most likely due to obstructive uropathy from bladder mass, had no urine output after Foley insertion. CT abdomen and pelvis showing bilateral hydronephrosis. Discussed with Dr. Selena BattenKim on call from urology service overnight who thinks the patient need bilateral percutaneous nephrostomy tube inserted. Discussed with interventional radiologist, Dr. Charlies SilversSuber, who reports it cannot be done until Monday, so [facility] as their interventional radiologist service can provide this service  today. The patient was accepted to Intensive Care Unit service secondary to his hypoglycemia by Dr. Barbee CoughYankavskav, the Intensive Care Unit attending.   The patient had hyperkalemia initially upon presentation of 6.1. It did not improve with p.o. Kayexalate, did not improve with albuterol and calcium gluconate, repeat value 6.3. Discussed with Dr. Mady HaagensenMunsoor Lateef who thinks the patient will need hemodialysis done today, so the patient will be transferred to Intensive Care Unit. Consulted Dr. Wyn Quakerew who will arrange for temporary hemodialysis catheter inserted today. Depends on the patient's condition after hemodialysis, he might be able to remain without nephrostomy tube until they are done on Monday.   PERTINENT LABS: Glucose 66, BNP 1,498, BUN 113, creatinine 10.49, sodium 139, potassium 6.3, chloride 113, CO2 14. GFR 4, ALT 20, AST 18, alkaline phosphatase 64, total bilirubin 0.3. White blood cell 5.8, hemoglobin 7.8, hematocrit 22.9, platelets 197. INR 1.2. Urinalysis: Leukocyte esterase negative, nitrite negative. CT of abdomen and pelvis showing bilateral pelvic caliectasis and urethral dilatation indicating hydronephrosis with markedly thickened and irregular bladder wall raising concern for bladder carcinoma.   CURRENT MEDICATIONS:  1. Tylenol/hydrocodone 325/5 mg 1 to 2 tablets every four hours as needed.  2. Norvasc 10 mg oral daily. 3. Dextrose 10W 75 mL per hour. 4. IV hydralazine 5 mg every four hours as needed.   DISCHARGE CONDITION: The patient is critical.    ____________________________ Starleen Armsawood S. Moroni Nester, MD dse:ap D: 01/14/2012 08:24:11 ET T: 01/14/2012 09:02:55 ET JOB#: [id number]  cc: Starleen Armsawood S. Kaesyn Johnston, MD, <Dictator> Catina Nuss Teena IraniS Cherelle Midkiff MD ELECTRONICALLY SIGNED 01/15/2012 8:23

## 2014-07-24 NOTE — Consult Note (Signed)
Admit Diagnosis:   ACUTE PYELONEPHRITIS: Onset Date: 19-Nov-2012, Status: Active, Description: ACUTE PYELONEPHRITIS   General Aspect Nephrology consult for acute on chronic kidney disease   Present Illness William Horn is a 74yo AAM with bladder cancer which resulted in renal obstruction and subsequent AKI requiring temporary dialysis about 1 year ago who subsequently developed CKD IIIb.  He has undergone radiation and chemotherapy for his malignancy.     He has been following with Dr. Jacqlyn Larsen of urology for management of ureteral stents and percutaneous nephrostomy tubes.  He presented to the [facility] ED Last night with fever to 103, chills and rigors.  He noted milky appearing urine and dysuria.  He was treated for sepsis with broad spectrum antibiotics (vancomycin and zosyn) and admitted to the hospital.  He was found to have GNR in the urine and blood.   He was seen by Dr. Jacqlyn Larsen as well who recommended unclamping the Right nephrostomy tube given infection, though the hydronephrosis on this side had improved (on noncontrasted CT on presentation yesterday) with replacement of the stent recently.  His left nephrostomy is also left to drain with plans for stent internalization soon, though not at the present due to infection.    His baseline creatinine recently has been in the high 1s, 1.9 on 8/7.  On presentation yesterday it was 2.62 and today it is 2.56.  His I/Os during the hospitalization are 2.1L/2.1L (including voided urine and BL nephrostomy tubes).  Today he reports that he's feeling a lot better already.  His appetite is improving and he denies any fevers, rigors today.  He has mild dependent edema of the ankles daily which resolves overnight.  He denies dyspnea, cough.  Urine is now clear.    Latex: Rash  Adhesive: Other  Case History and Physical Exam:  Chief Complaint rigors. fevers   Past Medical Health Hypertension, Diabetes Mellitus, Cancer, h/o severe AKI requiring HD, CKD IIIb-IV    Past Surgical History s/p multiple percutaneous nephrostomy tubes and ureteral stents   Family History no family history of CKD   HEENT PERLA   Neck/Nodes Supple   Chest/Lungs Clear   Cardiovascular Normal Sinus Rhythm  II/VI syst murmur LUSB, no JVD   Abdomen Benign   Musculoskeletal Full range of motion   Neurological Grossly WNL   Skin Warm   Nursing/Ancillary Notes: **Vital Signs.:   20-Aug-14 05:31  Vital Signs Type Routine  Temperature Temperature (F) 98.9  Celsius 37.1  Temperature Source oral  Pulse Pulse 80  Respirations Respirations 17  Systolic BP Systolic BP 932  Diastolic BP (mmHg) Diastolic BP (mmHg) 74  Mean BP 101  Pulse Ox % Pulse Ox % 98  Pulse Ox Activity Level  At rest  Oxygen Delivery Room Air/ 21 %    13:49  Vital Signs Type Routine  Temperature Temperature (F) 99.4  Celsius 37.4  Temperature Source oral  Pulse Pulse 80  Respirations Respirations 18  Systolic BP Systolic BP 355  Diastolic BP (mmHg) Diastolic BP (mmHg) 64  Mean BP 82  Pulse Ox % Pulse Ox % 99  Pulse Ox Activity Level  At rest  Oxygen Delivery Room Air/ 21 %  *Intake and Output.:   Shift 20-Aug-14 15:00  Grand Totals Intake:  240 Output:  425    Net:  -185 24 Hr.:  -185  Oral Intake      In:  240  Other Output ml     Out:  375  Other Output ml  Out:  50  Length of Stay Totals Intake:  2350 Output:  2550    Net:  -200   Routine Chem:  20-Aug-14 05:02   Glucose, Serum  101  BUN  42  Creatinine (comp)  2.56  Sodium, Serum  134  Potassium, Serum 4.2  Chloride, Serum 104  CO2, Serum 23  Calcium (Total), Serum  8.3  Anion Gap 7  Osmolality (calc) 279  eGFR (African American)  28  eGFR (Non-African American)  24 (eGFR values <30m/min/1.73 m2 may be an indication of chronic kidney disease (CKD). Calculated eGFR is useful in patients with stable renal function. The eGFR calculation will not be reliable in acutely ill patients when serum creatinine is  changing rapidly. It is not useful in  patients on dialysis. The eGFR calculation may not be applicable to patients at the low and high extremes of body sizes, pregnant women, and vegetarians.)   XRay:    19-Aug-14 02:52, Chest PA and Lateral  Chest PA and Lateral   REASON FOR EXAM:    cough fever  COMMENTS:       PROCEDURE: DXR - DXR CHEST PA (OR AP) AND LATERAL  - Nov 19 2012  2:52AM     RESULT: Comparison is made to study of 02/21/2012.    There is mild hyperinflation. The cardiac silhouette is normal. There is   no consolidation, edema, effusion or pneumothorax. Atherosclerotic   calcification is seen within the aortic arch. There appear to be changes   of a right ureteral double-J stent partially seen along with partial   visualization of presumed bilateral nephrostomy tubes.    IMPRESSION:   1. Hyperinflation consistent with COPD. No acute cardiopulmonary disease.    Verified By: GSundra Aland M.D., MD  CT:    19-Aug-14 03:10, CT Abdomen Pelvis WO for Stone  CT Abdomen Pelvis WO for Stone   REASON FOR EXAM:    bilateral nephrostomy tube with fever  COMMENTS:       PROCEDURE: CT  - CT ABDOMEN /PELVIS WO (STONE)  - Nov 19 2012  3:10AM     RESULT: History: Fever.     Comparison study: CT 09/18/2012.    Findings: Standard nonenhanced CT obtained. Liver normal. Spleen normal.   Pancreas normal. No gallbladder or biliary distention.    Adrenals normal. Nephrostomy tubes in good position. Previously   identified right hydronephrosis has subsided significantly. Small amount   air noted in the renal collecting systems could be related to     instrumentation or infection. There is a right double-J ureteral stent in   good position. Bladder wall is thickened. Bladder is nondistended.   Prostate is enlarged.    Small inguinal and retroperitoneal lymph nodes are present. These are   shotty. Abdominal aorta normal in caliber.    Appendix not visualized. Stool is  present throughout the a colon. No   evidence of bowel obstruction or free air. Esophagogastric and   gastroduodenal regions are normal. Rectal wall is thickened. This could   be inflammatory or malignant.     Lung bases are clear. No acute bony abnormality.    IMPRESSION:    1. Bilateral nephrostomies and right ureteral stent in good position.   Small amount of air noted in the renal collecting system and bladder,   this could be related to instrumentation or infection. The bladder wall   and rectal wall is thickened.  2. No acute abnormality otherwise noted.  Verified By: Osa Craver, M.D., MD    Impression [phone] with CKD, recent h/o bladder cancer resulting in lower urinary tract obstruction managed by nephrostomy tubes who is admitted with sepsis and is seen by nephrology for [facility].   Plan **AKI on CKD:  Baseline renal function of late seems to be in the 1.9 range, currently 2.6.  He has no evidence for persistent obstruction on recent noncontrasted imaging, so expect his AKI is secondary to systemic illness from bacteremia and pyelonephritis.  He is not oliguric and his creatinine is stable from presentation to today, so I expect recovery should occur soon barring any further insults. He does not need dialysis at this time.   --Allow to hydrate orally given taken good po intake currently  --Decreased zosyn dose from 4.5 q8h to 3.375q8hr based on renal function  **Anemia of CKD:  Asymptomatic with Hb in the mid 8s.  Given recent bladder cancer I will defer to his oncologist on epogen managment as I am unclear on his risk for recurrence of malignancy.   I will follow along while patient is hospitalized.  Please don't hesitate to page me with any questions or concerns.  Jannifer Hick MD [phone]   Electronic Signatures: Justin Mend (MD)  (Signed 20-Aug-14 17:31)  Authored: Health Issues, General Aspect/Present Illness, Allergies, History and Physical Exam,  Vital Signs, Labs, Radiology, Impression/Plan   Last Updated: 20-Aug-14 17:31 by Justin Mend (MD)

## 2014-07-24 NOTE — Discharge Summary (Signed)
PATIENT NAME:  William Horn, William Horn MR#:  [id number] DATE OF BIRTH:  1940-07-20  DATE OF ADMISSION:  12/13/2012 DATE OF DISCHARGE:  12/16/2012  ADMITTING PHYSICIAN: Cletis Athensavid K. Hower, MD  DISCHARGING PHYSICIAN: Enid Baasadhika Conley Pawling, MD  PRIMARY PHYSICIAN: Serita Shellerrnest B. Maryellen PileEason, MD  CONSULTATIONS IN THE HOSPITAL:  1.  ID consultation by Dr. Sampson GoonFitzgerald.  2.  Urology consultation by Dr. Lonna CobbStoioff.  DISCHARGE DIAGNOSES: 1.  Extended-spectrum beta-lactamase Klebsiella bacteremia.  2.  Sepsis from the bowel.  3.  Bladder cancer.  4.  Urinary tract infection and pyelonephritis.  5.  History of acute renal failure secondary to bladder cancer and obstruction resulting in a right ureteral stent and left nephrostomy tube placement.  6.  Hypertension.  7.  Diabetes.  8.  Chronic kidney disease with baseline creatinine around 2.3 to 2.6.   DISCHARGE HOME MEDICATIONS:  1.  Aspirin 81 mg p.o. daily.  2.  Glimepiride 0.5 mg p.o. daily.  3.  Toprol 50 mg p.o. daily.  4.  Norvasc 10 mg p.o. daily.  5.  Primaxin 500 mg IV b.i.d. for 21 days.   DISCHARGE DIET: Low-sodium diet.   DISCHARGE ACTIVITY: As tolerated.    FOLLOWUP INSTRUCTIONS: 1.  Follow up with Dr. Sampson GoonFitzgerald in 1 week.  2.  Follow up with Dr. Achilles Dunkope in 1 to 2 weeks.  3.  PCP followup in 2 to 3 weeks.  4.  CBC and CMP q. weekly for the next 3 weeks.  5.  Home health nursing for IV antibiotics.   LABORATORY AND IMAGING STUDIES PRIOR TO DISCHARGE: Urinalysis still showing 3+ leukocyte esterase with 600 WBCs and trace bacteria. Cultures negative so far. Blood cultures from 12/15/2012 are negative. Initial blood cultures from 12/13/2012 are growing Klebsiella pneumonia, which is ESBL-positive. Nephrostogram done showing good positioning of left nephrostomy tube.   Sodium 135, potassium 3.9, chloride 107, bicarbonate 22, BUN 36, creatinine 3.1, glucose 128 and calcium of 8.1.   WBC 6.9, hemoglobin 8.4, hematocrit 24.0, platelet count of 240.   BRIEF  HOSPITAL COURSE: Mr. William Horn is a 74 year old African American male with a past medical history significant for history of bladder cancer resulting in hydronephrosis requiring bilateral nephrostomy tubes and was admitted in the hospital in August 2014 for ESBL sepsis, was discharged on IV Primaxin, now comes back secondary to sepsis with fevers and chills and hypertension on admission.  1.  Sepsis: Blood cultures again growing ESBL Klebsiella pneumonia, which is similar from the last time. It seems the patient has a right ureteral stent and left nephrostomy tube, which are chronic for his hydronephrosis secondary to renal failure from his bladder cancer causing obstruction. His blood might have cleared with Primaxin the last time. Blood cultures had cleared up with the Primaxin and because he has foreign bodies with stent and the tube, his cultures were positive again. He was seen by ID again during this hospitalization and they feel that the only way to get rid of the infection is to take out the plastic stent and replace with a metal one and internalization of the left nephrostomy tube, because without the nephrostomy tube and stent, the patient would die from acute renal failure. Before doing those procedures, he needs to be sterilized by negative blood cultures, so he will be discharged on Primaxin again based on the sensitivities and home health will be helping with b.i.d. dosing of the Primaxin and followup CBC and CMP as an outpatient. He will follow up with Dr. Achilles Dunkope in the  next 1 to 2 weeks and hopefully blood cultures are negative, and both procedures can be achieved at one point. For now, the plan is to treat for 3 weeks and he will follow up with Dr. Sampson Goon in 3 weeks. The patient already has a right chest subclavian line for previous IV antibiotics, but it is rare for a gram-negative organism to seed the line, so since his repeat cultures were negative, Dr. Sampson Goon recommended to keep that same  line for further IV antibiotics and so no new PICC line was placed at this time.  2.  History of bladder cancer: Finished recent chemotherapy. Has bilateral hydronephrosis secondary to same and has a left nephrostomy tube and a right stent put in at this time.  3.  Acute on chronic kidney disease: Known history of chronic kidney disease stage IV, baseline creatinine around 2.3 and has been stable during the hospital course after IV fluids. 4.  Anemia of chronic disease: Hemoglobin stable.  5.  Hypertension: Blood pressure medications were adjusted and the patient is to be discharged on metoprolol and Norvasc at the time of discharge.  6.  His course has been otherwise uneventful in the hospital.   DISCHARGE CONDITION: Stable.   DISCHARGE DISPOSITION: With home health nursing.  TIME SPENT ON DISCHARGE: 45 minutes.   ____________________________ Enid Baas, MD rk:jm D: 12/17/2012 15:42:25 ET T: 12/17/2012 20:43:46 ET JOB#: [id number]  cc: Enid Baas, MD, <Dictator> Madolyn Frieze. Achilles Dunk, MD Stann Mainland. Sampson Goon, MD Serita Sheller Maryellen Pile, MD Enid Baas MD ELECTRONICALLY SIGNED 12/18/2012 10:17

## 2014-07-24 NOTE — Op Note (Signed)
PATIENT NAME:  William Horn, BART MR#:  [id number] DATE OF BIRTH:  [birth year]  DATE OF PROCEDURE:  01/07/2013  PRINCIPAL DIAGNOSES:   1.  Hydronephrosis. 2.  Chronic multidrug resistant bacterial colonization. 3.  History of bladder cancer.   POSTOPERATIVE DIAGNOSES: 1.  Hydronephrosis. 2.  Chronic bacterial colonization.  3.  Bladder mass.   PROCEDURE: 1.  Cystoscopy, bilateral double-J ureteral stent change.  2.  Cystoscopy, bladder biopsy.   SURGEON: Assunta Gambles, MD  ANESTHESIA: Laryngeal mask airway anesthesia.   INDICATIONS: The patient is a 74 year old African American gentleman with a history of muscle invasive bladder cancer. He has undergone previous radiation therapy and chemotherapy. He has developed bilateral hydronephrosis related to the radiation therapy. He has been managed with chronic ureteral stents and nephrostomy tubes. He underwent recent left nephrostomy tube removal with stent internalization due to the presence of an multidrug resistant bacteria that has been very difficult to clear. The decision has been made to proceed with stent change while he was on IV antibiotics in an attempt at resolving the bacteria. He presents for this purpose.   DESCRIPTION OF PROCEDURE: After informed consent was obtained, the patient was taken to the operating room and placed in the dorsal lithotomy position under laryngeal mask airway anesthesia. A cystoscopy was then performed with a 22 French rigid cystoscope. The scope was easily advanced into the urinary bladder. Upon insertion into the bladder, a moderate amount of debris and mucus was noted on the bladder base. The stents were noted protruding from both ureteral orifices. A guidewire was advanced into the left ureteral orifice and into the upper pole collecting system without difficulty. The cystoscope was removed. The cystoscope was replaced. The stent was removed utilizing grasping forceps. The sheath for the resonance stent was  advanced through the cystoscope. The scope was backloaded over the guidewire utilizing the sheath. The sheath was then advanced to the level of the ureteropelvic junction under visual and fluoroscopic guidance. With the sheath in place, a 6 French x 26 cm resonance stent was advanced through the sheath. The sheath was then withdrawn. Adequate curl was maintained within the renal pelvis. Adequate curl was also noted within the urinary bladder. A guidewire was then advanced into the right ureteral orifice alongside of the right stent. It was advanced into the upper pole collecting system. The cystoscope was removed. The cystoscope was then replaced alongside the guidewire. Grasping forceps were then utilized to remove the right resonance stent. The scope was then back loaded over the guidewire utilizing the sheath. The sheath was advanced under visual and fluoroscopic guidance to the level of the ureteropelvic junction on the right. A second new 6 French x 26 cm resonance double-J ureteral stent was advanced through the sheath. Adequate curl was noted within the renal pelvis. Adequate curl was also noted within the urinary bladder. On further visualization of the bladder, there were two nodular masses on the anterior aspect of the bladder with several areas of flat irregular-appearing mucosa. There were two additional lesions along the posterior bladder wall and left bladder base that demonstrated early papillary changes with prominent hypervascularity. Cold cup biopsy forceps were utilized to obtain biopsies of each of these areas. The areas were then cauterized as well as the area of irregular mucosa on the anterior bladder wall. No significant bleeding was encountered. The bladder was drained. The cystoscope was removed. The patient was returned to the supine position and awakened from laryngeal mask airway anesthesia, was taken to  the recovery room in stable condition. There were no problems or complications.     ____________________________ Madolyn FriezeBrian S. Achilles Dunkope, MD bsc:dp D: 01/08/2013 08:14:10 ET T: 01/08/2013 08:25:17 ET JOB#: [id number]  cc: Madolyn FriezeBrian S. Achilles Dunkope, MD, <Dictator> Madolyn FriezeBRIAN S Terilynn Buresh MD ELECTRONICALLY SIGNED 01/08/2013 13:42

## 2014-07-24 NOTE — Discharge Summary (Signed)
PATIENT NAME:  William Horn, Mehtab G MR#:  [id number] DATE OF BIRTH:  04-23-1940  DATE OF ADMISSION:  11/06/2012  DATE OF DISCHARGE:  11/08/2012  PRINCIPAL DIAGNOSES:  1.  Retained right stent. 2.  Ureteral stricture. 3.  Hydronephrosis. 4.  History of bladder cancer.   PROCEDURE: Right percutaneous nephroscopy with stent removal and percutaneous stent placement, cystoscopy.   HOSPITAL COURSE: [name] was taken to the Operating Room on 11/06/2012, at which time he underwent percutaneous stent removal through an existing nephrostomy tube. The stent was able to be removed with a metallic resonance stent placed. His postoperative course was significant for fever to 102 in the immediate postoperative period. He had subsequent gradual defervescence. He did have some initial chills. He had no elevation of white blood cell count, and his renal function was stable. This is likely related to bacteria from the nephroscopy dilation. He was covered with IV antibiotics, with no evidence of sepsis otherwise. No cultures were obtained, based on this situation. The initial plan was to clamp the nephrostomy tube and evaluate function of the stent, based on his fever. This was held until postoperative day 2. The nephrostomy tube was clamped. He had no significant pain or discomfort. There was some leakage at the connector site. This was corrected with tightening of the connector. Vital signs stable throughout the remainder of his hospitalization. His last fever was early morning of August 6. He was tolerating a general diet. He was ambulating without difficulty. After the nephrostomy tube was clamped, he started to void per urethra without difficulty. He had good urine output. He had no significant pain or discomfort. He continued to gradually improve. He was subsequently discharged to home on 11/07/2012 with the nephrostomy tube clamped on the right. The left was continued to drainage. He was voiding without difficulty. He  was discharged on oral pain medication and Cipro 500 mg for a 7-day course. He is otherwise to restart his prehospital medications. He is to avoid any heavy lifting or straining. Outpatient followup for blood work and probable right nephrostomy tube removal will be arranged. Also of note, he has chronic anemia related to chronic renal failure, and his history of bladder cancer with radiation and chemotherapy. His prehospital hemoglobin was 7.1 at the point of borderline transfusion. Postprocedure with IV hydration, he dropped his hemoglobin to 6.5, necessitating transfusion of 2 units of packed red blood cells. He had significant improvement in his hemoglobin levels. There was no acute blood loss. Based on his other health issues, transfusion is indicated. He had no significant problems related to the transfusion. He did feel better post-transfusion. He was discharged to home on 11/08/2012. He is to notify us if there are any further problems or questions in the interim.     ____________________________ Madolyn FriezeBrian S. Achilles Dunkope, MD bsc:mr D: 11/11/2012 19:34:12 ET T: 11/11/2012 20:52:37 ET JOB#: [id number]  cc: Madolyn FriezeBrian S. Achilles Dunkope, MD, <Dictator> Madolyn FriezeBRIAN S Clarece Drzewiecki MD ELECTRONICALLY SIGNED 11/12/2012 14:22

## 2014-07-24 NOTE — H&P (Signed)
PATIENT NAME:  William Horn, William Horn MR#:  [id number] DATE OF BIRTH:  03-03-1941  DATE OF ADMISSION:  11/19/2012  PRIMARY CARE PHYSICIAN: Alden Server B. Maryellen Pile, MD  UROLOGIST: Madolyn Frieze. Achilles Dunk, MD  REFERRING PHYSICIAN: Enedina Finner. Manson Passey, MD  CHIEF COMPLAINT: Fever and chills since yesterday.   HISTORY OF PRESENT ILLNESS: William Horn is a 74 year old African-American male with a history of chronic kidney disease stage IV, history of bladder cancer, hydronephrosis, status post bilateral nephrostomy. Last admission to this hospital was earlier this month on August 6th and discharged on August 8th. The patient underwent right percutaneous nephroscopy with stent removal and percutaneous stent placement. He also had cystoscopy. The patient was doing well until yesterday, when he developed fever reaching 103, associated with chills. He has mild dysuria and reported a change in urine color towards milky-like color. He reports no abdominal pain, no back pain. No vomiting. No diarrhea. No hematochezia. No melena. Evaluation here at the Emergency Department was consistent with sepsis, and the source appears to be the urine. The hospitalist was called to admit the patient for further evaluation and management.   REVIEW OF SYSTEMS:  CONSTITUTIONAL: He reports fever reaching 103, associated with chills. Mild sweating. No fatigue.  EYES: No blurring of vision. No double vision.  ENT: No hearing impairment. No sore throat. No dysphagia.  CARDIOVASCULAR: No chest pain. No shortness of breath. No syncope.  RESPIRATORY: No shortness of breath. No chest pain. No cough. Earlier, like several days ago, he had some cough, but that had improved. Denies any hemoptysis.  GASTROINTESTINAL: No abdominal pain, no vomiting, no diarrhea, no melena.  GENITOURINARY: He has mild dysuria and change in the urine color since yesterday.  MUSCULOSKELETAL: No joint pain or swelling. No muscular pain or swelling.  INTEGUMENTARY: No skin rash, no  ulcers.  NEUROLOGY: No focal weakness. No seizure activity. No headache.  PSYCHIATRY: No anxiety. No depression.  ENDOCRINE: No polyuria or polydipsia. No heat or cold intolerance.   PAST MEDICAL HISTORY:  1. Recent admission on August 6th, discharged on August 8th after treatment for his hydronephrosis. He underwent right percutaneous nephroscopy with stent removal and percutaneous stent placement. He also had cystoscopy. This was done by Dr. Assunta Gambles.  2. In November 2013, he was also admitted with sepsis and Enterococcus bacteremia along with urinary tract infection with Enterococcus and Pseudomonas.  3. Stage IV chronic kidney disease.  4. Bladder cancer.  5. Anemia of chronic diseases.  6. Systemic hypertension.  7. Thrombocytopenia.  8. Diabetes mellitus type 2.   PAST SURGICAL HISTORY:  1. Recent cystoscopy and nephroscopy with stent removal and stent replacement.  2. Bilateral nephrostomy tube placement.  3. Biopsy from bladder tumor.  4. Cataract surgery.   FAMILY HISTORY: His father died at age of 87. He does not know the reason of his death. His mother died at age of 54 of natural causes. There is negative family history of cancer.   SOCIAL HABITS: Nonsmoker. No history of alcohol or drug abuse.   SOCIAL HISTORY: The patient is married. He is living with his wife. He retired from working at [employer]. His job was setting up tables and chairs. The patient has 10 children, 5 girls and 5 boys.   ADMISSION MEDICATIONS:  1. Metoprolol succinate 25 mg taking 1-1/2 tablets a day, that is 37.5 mg once a day.  2. Glimepiride 1 mg once a day. 3. FeroSul 325 mg a day.  4. Aspirin 81 mg a  day.   ALLERGIES: LATEX, CAUSING SKIN RASH.   PHYSICAL EXAMINATION:  VITAL SIGNS: Blood pressure 184/77, respiratory rate 16, pulse 98, temperature 99.9. Pulse oximetry 98%.  GENERAL APPEARANCE: Elderly male lying in bed in no acute distress, noted to have shivering.  HEENT: No  pallor. No icterus. No cyanosis. Ear examination revealed normal hearing. No discharge, no lesions. Examination of the nose showed no bleeding, no discharge, no ulcers. Oropharyngeal examination revealed normal lips and tongue. The tongue was coated, but there is no evidence of oral thrush. No ulcers. Eye examination revealed normal eyelids and conjunctivae. Both pupils were constricted.  NECK: Supple. Trachea at midline. No thyromegaly. No cervical lymphadenopathy. No masses.  HEART: Normal S1, S2. No S3, S4. No murmur. No gallop. No carotid bruits.  RESPIRATORY: Normal breathing pattern without use of accessory muscles. No rales. No wheezing.  ABDOMEN: Soft, without tenderness. No hepatosplenomegaly. No masses. No hernias.  BACK: Over the back area, he has 2 nephrostomy tubes from either side of the back.  SKIN: No ulcers. No subcutaneous nodules.  MUSCULOSKELETAL: No joint swelling. No clubbing.  NEUROLOGIC: Cranial nerves II through XII are intact. No focal motor deficit.  PSYCHIATRIC: The patient is alert and oriented x3. Mood and affect were normal.   LABORATORY FINDINGS: His chest x-ray showed no acute cardiopulmonary abnormalities. CT scan of the abdomen showed interval decrease in the moderate right hydronephrosis status post right percutaneous nephrostomy tube replacement. Evidence of gas within the bilateral renal collecting system. Irregular bladder wall thickening. Serum glucose 106, BUN 54, creatinine 2.6, sodium 132, potassium 4.8. Estimated GFR is 27. Normal liver function tests and liver transaminases. Albumin was slightly low at 2.7. CBC showed a white count of 10,000, hemoglobin 8.3, hematocrit 23, platelet count 203. Normal MCV, MCH and MCHC. Urinalysis showed cloudy urine with 400 white blood cells, +2 bacteria.   ASSESSMENT:  1. Acute pyelonephritis associated with the presence of foreign body in both kidney pelvis. He is status post bilateral nephrostomy tubes and recent  replacement of stent.  2. Chronic kidney disease stage IV.  3. Anemia of chronic diseases. 4. Bladder cancer.  5. Bilateral hydronephrosis.  6. Mild hyponatremia.  7. Systemic hypertension.  8. Diabetes mellitus type 2.   PLAN: Will admit the patient to the medical floor after taking blood cultures and urine culture. Start dual antibiotic to cover for both gram-positive and gram-negative. Of note, the patient had enterococcus bacteremia in November 2013. The source was urinary tract infection. Therefore, will start the patient on vancomycin and Zosyn pending results of the cultures. The pharmacy will adjust the dose according to the kidney function. Continue the home medications as reported. Consult urology, Dr. Achilles Dunkope. For deep vein thrombosis prophylaxis, will place him on subcutaneous heparin at small dose, 5000 units twice a day.   CODE STATUS: The patient indicates that he has no living will. His code status is full code.   TIME SPENT IN EVALUATING THIS PATIENT: Took more than 55 minutes, including reviewing his medical records and discussion with him and his wife about the plans and management.   ____________________________ Carney CornersAmir M. Rudene Rearwish, MD amd:OSi D: 11/19/2012 05:45:34 ET T: 11/19/2012 08:35:57 ET JOB#: [id number]  cc: Carney CornersAmir M. Rudene Rearwish, MD, <Dictator> Karolee OhsAMIR Dala DockM Josaphine Shimamoto MD ELECTRONICALLY SIGNED 11/20/2012 1:50

## 2014-07-24 NOTE — Op Note (Signed)
PATIENT NAME:  William Horn, William Horn MR#:  [id number] DATE OF BIRTH:  11-04-1940  DATE OF PROCEDURE:  06/11/2012  PREOPERATIVE DIAGNOSES:  1. Right hydronephrosis.  2. Bladder cancer.   POSTOPERATIVE DIAGNOSES:  1. Right hydronephrosis. 2. Bladder cancer.   PROCEDURE: Cystoscopy, attempted unsuccessful right ureteral stent placement.   SURGEON: Assunta GamblesBrian Cope, MD   ANESTHESIA: Laryngeal mask airway anesthesia.   INDICATIONS: The patient is a 74 year old African American gentleman who was diagnosed with muscle-invasive bladder cancer several months prior. He underwent bilateral percutaneous nephrostomy tube placement due to bilateral ureteral obstruction and renal failure. He has undergone 3 cycles of chemotherapy with exceptional response from the bladder cancer. Recent cystoscopy demonstrated significant reduction in tumor.  The ureteral orifices were identified at that time. His left nephrostomy tube was able to be removed after a successful nephrostogram showed no evidence of obstruction. The right, however, demonstrated persistent obstruction. He presents for cystoscopy, right double-J ureteral stent placement. In the interim, he has started radiation therapy to the bladder.   DESCRIPTION OF PROCEDURE: After informed consent was obtained, the patient was taken to the operating room and placed in the dorsal lithotomy position under laryngeal mask airway anesthesia. The patient was then prepped and draped in the usual standard fashion. The 22-French rigid cystoscope was introduced into the urethra under direct vision with no urethral abnormalities noted. Upon entering the prostate, moderate bilobar hypertrophy was noted with visual obstruction. The prostate fossa was noted to be of average length. Upon entering the bladder, a moderate amount of debris was encountered. This was irrigated free. He was noted to have numerous large bullous inflammatory changes along the right trigone and bladder base. The  right trigone was slightly elevated compared to the left. The left ureteral orifice was identifiable. This was not readily apparent that it was the left ureteral orifice. An attempt at cannulization with a guidewire demonstrated the guidewire advancing into the left collecting system. The area where the ureteral orifice on the right should reside demonstrated no definitive evidence of an opening. With continued prodding and manipulation utilizing an open-ended catheter and guidewire, what was thought to be the ureteral orifice was identified within several folds of the raised trigone region. Multiple attempts were made at advancing the guidewire which were unsuccessful. The entire region of the right trigone was manipulated with the guidewire. Indigo carmine was also given with clamping of the nephrostomy tube. No contrast was visible within the bladder from either ureteral orifice. After multiple attempts at advancing the guidewire into what was felt to be the ureteral orifice, the decision was made to abort and to proceed with scheduling of percutaneous stent placement. The bladder was drained. The cystoscope was removed. The patient was returned to the supine position. He was awakened from laryngeal mask airway anesthesia. He was taken to the recovery room in stable condition. There were no problems or complications. The patient tolerated the procedure well.   ____________________________ Madolyn FriezeBrian S. Achilles Dunkope, MD bsc:cb D: 06/11/2012 16:08:04 ET T: 06/11/2012 17:05:35 ET JOB#: [id number]  cc: Madolyn FriezeBrian S. Achilles Dunkope, MD, <Dictator> Madolyn FriezeBRIAN S COPE MD ELECTRONICALLY SIGNED 06/12/2012 8:20

## 2014-07-24 NOTE — H&P (Signed)
PATIENT NAME:  JAMAREON, SHIMEL MR#:  [id number] DATE OF BIRTH:  Mar 10, 1941  DATE OF ADMISSION:  12/13/2012  PRIMARY CARE PHYSICIAN:  Nicky Pugh, M.D.   REFERRING PHYSICIAN:  Marjean Donna, M.D.   HISTORY OF PRESENT ILLNESS:  Mr. Kissinger is a 74 year old African-American gentleman with a past medical history of stage 4 chronic kidney disease, bladder cancer who completed chemotherapy 2 months ago, hydronephrosis requiring bilateral nephrostomy tubes, right tube has been removed and had ureteral stent placement.  He had the left nephrostomy tube in place and he also has a history of type 2 diabetes. He is presenting with a few hours of fever. He received blood transfusions this morning as an outpatient, which finished by approximately 2:00 p.m.; went home without symptoms. Some hours later, his wife noticed that he felt hot and gave him Tylenol and brought him to the Emergency Department. The patient stated that if he ever had a fever, he was to present to the Emergency Department, actually denied any further symptoms.  In the ED, he was found to be febrile at 101.7 and have urinalysis with infection. Otherwise, he has no complaints.   REVIEW OF SYSTEMS: CONSTITUTIONAL: Positive for fever, denies any fatigue, weakness.  EYES: Denies any double vision or pain.  ENT: Denies ear pain or dysphagia.  RESPIRATORY: Denies cough, wheezing, shortness of breath.  CARDIOVASCULAR: Denies chest pain, palpitations.  GASTROINTESTINAL: Denies any nausea, vomiting, diarrhea, abdominal pain. GENITOURINARY: Denies any change in urine from the nephrostomy tube or normal urine flow, denies dysuria.  ENDOCRINE: Denies nocturia or thyroid problems.  HEMATOLOGIC/LYMPHATIC: Denies easy bruising or bleeding, though anemia as stated above.  SKIN: Denies rash or lesions.  MUSCULOSKELETAL: Denies neck pain or back pain.  NEUROLOGIC: Denies paralysis or paresthesias.  PSYCHIATRIC: Denies anxiety or depressive symptoms.    PAST MEDICAL HISTORY: Stage 4 chronic kidney disease, bladder cancer and completed chemotherapy, anemia of chronic disease, hypertension, thrombocytopenia, type 2 diabetes, recent admissions for sepsis with enterococcus or E. faecalis, as well as Pseudomonas and most recently with multidrug resistant Klebsiella.   PAST SURGICAL HISTORY:  FAMILY HISTORY: Denies any history of cancer or cardiac disease to his knowledge.   SOCIAL HISTORY: Denies alcohol, tobacco or drug usage.   ALLERGIES: ADHESIVES AND LATEX.   HOME MEDICATIONS: Aspirin 81 mg p.o. daily, Feosol 325 mg daily, glimepiride 0.5 mg p.o. daily, metoprolol 25 mg extended release 1.5 tabs p.o. daily.    PHYSICAL EXAMINATION: VITAL SIGNS: Temperature 101.7, heart rate 99, respirations 18, blood pressure 168/74, saturating 97% on room air. Weight 81.6 kg, BMI 22.5.  GENERAL: In no acute distress, awake, alert and oriented x 3.  HEENT: Normocephalic, atraumatic. Extraocular muscles intact. Pupils equal and reactive to light, as well as accommodation. Moist mucosal membranes.  CARDIOVASCULAR: S1, S2, regular rate and rhythm. No murmurs, rubs or gallops. Port over his chest is in place without surrounding erythema or edema. No discharge noted.  PULMONARY: Clear to auscultation bilaterally without wheezes, rubs or rhonchi.  ABDOMEN: Soft, nontender, nondistended with positive bowel sounds, left nephrostomy tube in place without erythema or edema.  EXTREMITIES: No cyanosis, edema or clubbing.  NEUROLOGIC: Cranial nerves II through XII intact. No gross neurological deficits noted.   LABORATORY DATA: Sodium 133, potassium 4.4, chloride 104, bicarb 23, BUN 37, creatinine 2.05, glucose 82. Total protein 7, albumin 2.6, bilirubin 0.6, alk phos 74, AST 18, ALT 12, WBC 8.2, hemoglobin 9, MCV 91, RDW 14.3, platelets 233. Urinalysis: 3+ leukocyte esterase, 3+  blood, nitrate negative, 2+ bacteria.   ASSESSMENT AND PLAN: This is a 74 year old  African-American gentleman with a  history of stage 4 kidney disease, as well as bladder cancer, requiring nephrostomy tubes for nephrolysis. He has a left nephrostomy tube that is in place. He has a right ureteral stent, who presented with a few hour duration of fever, but does have a history significant of recent Klebsiella bacteremia. He was found to be febrile of 101.7 in the Emergency Department with a urinalysis significant for infection.  1.  Sepsis with urinary source, complicated by nephrostomy tube and ureteral stent. Given his microbiological history of multidrug resistant Klebsiella in the blood and urine, as well as pseudomonas and E. faecalis, I have opted to add meropenem to the antibiotic coverage regimen to the vancomycin and Zosyn that was already given by the Emergency Department.  He has been pancultured including blood and urine, IV fluid hydration to keep map greater than 65, consult urology, Dr. Jacqlyn Larsen, for further management requiring nephrostomy tube. 2.  Type 2 diabetes, hold p.o. agents, add sliding scale.  3.  Hypertension. Continue metoprolol. 4.  Anemia, normocytic, chronic in nature. No indication for transfusion at this time.  5.  Chronic kidney disease related, dose medications.  6.  THE PATIENT IS FULL CODE.   TIME SPENT: 35 minutes    ____________________________ Aaron Mose. Hower, MD dkh:nts D: 12/13/2012 02:14:58 ET T: 12/13/2012 02:56:26 ET JOB#: [id number]  cc: Aaron Mose. Hower, MD, <Dictator> DAVID Woodfin Ganja MD ELECTRONICALLY SIGNED 12/14/2012 21:13

## 2014-07-24 NOTE — Op Note (Signed)
PATIENT NAME:  William Horn, DELAPENA MR#:  [id number] DATE OF BIRTH:  04-07-1940  DATE OF PROCEDURE:  11/05/2012  PRINCIPAL DIAGNOSIS:  1.  Right retained ureteral stent.  2.  Right ureteral stricture with hydronephrosis.  3.  Bladder cancer.   POSTOPERATIVE DIAGNOSES: 1.  Right retained ureteral stent.  2.  Right ureteral stricture with hydronephrosis.  3.  Bladder cancer.   PROCEDURE:  1.  Right percutaneous nephroscopy with stent removal.  2.  Right percutaneous ureteral stent placement.  3.  Cystoscopy.   SURGEON: Assunta Gambles, M.D.   ANESTHESIA: General endotracheal anesthesia.   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 He has undergone radiation therapy and chemotherapy. He developed bilateral hydronephrosis due to thickening of the bladder. An attempt was made at stent placement from the bladder, which was unsuccessful due to the degree of inflammation post radiation. He underwent bilateral nephrostomy tube placement for worsening hydronephrosis and renal failure. An attempt was made at right stent internalization. It was felt that the stent was in the bladder. It, however, was curled in the distal ureter. He had progressive worsening of his renal function status. He had presented for left stent internalization when CT scan demonstrated the stent in the distal ureter. An attempt was made at percutaneous removal, which was unsuccessful. He presents for percutaneous nephroscopy and stent removal and attempted stent placement as well as routine cystoscopy for bladder tumor follow-up.   DESCRIPTION OF PROCEDURE: After informed consent was obtained, the patient was taken to the operating room and placed in the prone position on the operating table under general endotracheal anesthesia. The patient was then prepped and draped in the usual standard fashion. The indwelling nephrostomy tube on the  right was utilized. The sutures were cut from the skin. The tube was manipulated angling the tip near the ureteral pelvic junction. A guidewire was advanced through the nephrostomy tube into the ureter. It was noted to curl in the lower ureter. It did not advance into the urinary bladder. The stiffener was placed over the guidewire after removal of the nephrostomy tube with the stiffener in the lower ureter. The guidewire was pulled back and readvanced. As the guidewire was pushed, it was noted to extend into the urinary bladder with some manipulation. A significant curl of the guidewire was able to be accomplished in the bladder. The stiffener was advanced further into the bladder. The guidewire was removed. Contrast was injected through the stiffener, demonstrating contrast in the bladder confirming presence of the guidewire within the bladder. The stiffener was slightly pulled back. The sheath was placed over the stiffener. The stiffener was removed. A Super Stiff guidewire was advanced through the sheath into the lower ureter. The stiffener was replaced after removal of the sheath. Dilation was then begun at the 22 French, which was continued to 24 [place], the 24 [place] sheath was then placed. Nephroscopy was performed with the 22 French rigid cystoscope. The stent was easily identified in the renal pelvis. It was grasped utilizing grasping forceps and removed under visual and fluoroscopic guidance without further difficulty since the initial flexible Glidewire was advanced through the ureteropelvic junction and to the bladder. The nephrostomy sheath was removed. The safety guidewire placed into the bladder was then utilized. The decision was made to place a resonance stent. The stent sheath was advanced over the safety guidewire into the urinary bladder. Once again, contrast was injected through  the sheath confirming presence within the bladder. A 6 French x 26 cm resonance stent was advanced through the  sheath. Adequate curl was noted within the urinary bladder. The sheath was withdrawn under fluoroscopic guidance with adequate curl noted within the renal pelvis. It appeared to be relatively low. The sheath was replaced utilizing the 24 French dilator with the sheath in place. The stent could be easily identified with the 22 [place] rigid scope. It was grasped and pulled slightly back further into the renal pelvis with good positioning noted. Utilizing the sheath, a 10 French nephrostomy tube was advanced through the tip of the sheath. As the stiffener was pulled back, the tip was angled more superior. It was advanced into a more upper portion of the renal pelvis. The string was withdrawn completing the curl. This was to insure no marriage of the two curls within the renal pelvis. This was confirmed. The sheath was then removed, 2-0 Prolene sutures were placed in the skin to close the nephrostomy site. A third 0 Prolene suture was secured to the skin and secured to the nephrostomy tube. The nephrostomy tube was connected to the extension and to the catheter bag. A clean, dry dressing was then applied to the nephrostomy tube site. The patient was returned to the supine position on the stretcher. He was reprepped and draped in the standard fashion. Cystoscopy was performed with a 17 French flexible cystoscope. No significant urethral abnormalities were noted. Upon entering the prostate, moderate bilobar hypertrophy was noted with only partial visual obstruction upon entering the bladder. The stent was noted protruding from the right ureteral orifice. 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 An additional area was noted on the anterior bladder wall. No gross tumor was visualized throughout the remainder of the bladder. The left ureteral orifice could not be identified. The cystoscope was utilized to irrigate the bladder. The washings were collected to be sent for  cytology. The cystoscope was then removed. The patient was then awakened from general endotracheal anesthesia and was taken to the recovery room in stable condition. There were no problems or complications. The patient tolerated the procedure well. Estimated blood was minimal.  :    ____________________________ Madolyn FriezeBrian S. Achilles Dunkope, MD bsc:cc D: 11/05/2012 13:04:06 ET T: 11/05/2012 14:09:10 ET JOB#: [id number]  cc: Madolyn FriezeBrian S. Achilles Dunkope, MD, <Dictator> Madolyn FriezeBRIAN S Durward Matranga MD ELECTRONICALLY SIGNED 11/07/2012 8:06

## 2014-07-24 NOTE — Consult Note (Signed)
PATIENT NAME:  William Horn, William Horn MR#:  [id number] DATE OF BIRTH:  [birth year]  DATE OF CONSULT:  12/15/2012  CONSULTING PHYSICIAN:  Stann Mainlandavid P. Sampson GoonFitzgerald, MD  REQUESTING PHYSICIAN:  Dr. Enid Baasadhika Kalisetti  REASON FOR CONSULTATION:  Bacteremia and recurrent urinary tract infections.  HISTORY OF PRESENT ILLNESS:  This is a very pleasant, 74 year old gentleman, who I had seen on his last admission when he had a urinary tract infection and bacteremia, with an ESBL-producing klebsiella. He has a history of known bladder cancer diagnosed in 2013, as well as hydronephrosis requiring nephrostomy tubes. He has a history of chronic kidney disease. At his last admission, he had internalization of his left nephrostomy tube. He was treated as an outpatient with 2 weeks of ertapenem after discharge. However, following cessation of his antibiotics, he was found to be anemic, and had a transfusion at the cancer center. Following the transfusion, he developed high spiking fevers. He came to the emergency room, where he was found to have a temperature of 101.7. White blood count was normal at 8.2. His urinalysis was 3+. He ended up growing the same ESBL-producing klebsiella species from both his urine and his blood. Of note, as an outpatient, I had seen him and repeated a urine culture while he was just finishing his ertapenem. This had cleared, with a normal urinalysis and negative cultures.   Since his admission, he has been on appropriate therapy and has responded clinically. We are consulted for further recommendations.   PAST MEDICAL HISTORY:   1.  Chronic kidney disease. 2.  Bladder cancer, status post chemotherapy. 3.  Anemia of chronic disease. 4.  Hypertension. 5.  Thrombocytopenia. 6.  Type 2 diabetes. 7.  Admission in November 2013 with sepsis and enterococcus bacteremia as well as enterococcal and pseudomonal UTI.  PAST SURGICAL HISTORY:   1.  Cystoscopy and nephroscopy with stent placement and removal in  August 2014.  2.  Bilateral nephrostomy tube placement. 3.  Cataract surgery.  FAMILY HISTORY:  Father died at age 76. Mother died at age 76 of natural causes.  SOCIAL HISTORY:  He is married. He is a nonsmoker. He does not drink. He is retired from [employer]. He has 10 children.  REVIEW OF SYSTEMS:  Eleven systems reviewed and negative, except as per HPI.  ALLERGIES:  LATEX, which causes a skin rash.   MEDICATIONS:  Antibiotics since admission include meropenem 1 gram IV q. 12. Other medications include amlodipine, Zyrtec, heparin, sliding scale insulin, Tylenol. Of note, he also received a dose of vanco and Zosyn on admission.  PHYSICAL EXAMINATION: VITAL SIGNS:  T-max in the last 24 hours is 99.9, blood pressure 162/84, pulse 99, respirations 20, sat of 95% in room air. GENERAL:  This is a thin gentleman chroniclly ill  appearing.  HEENT:  Pupils equal, round, reactive to light and accommodation. Extraocular movements are intact. Sclerae are anicteric. Oropharynx is clear. NECK:  Supple.  HEART:  Regular. LUNGS:  Clear to auscultation bilaterally. ABDOMEN:  Soft, nontender, nondistended. He has in his left side a nephrostomy tube with clear yellow urine.  EXTREMITIES:  No clubbing, cyanosis, or edema. NEUROLOGIC:  He is alert and oriented x 3, grossly nonfocal neuro exam.  DATA:  Urine culture on admission is growing klebsiella pneumonia, greater than 100,000 colony-forming units. It is an ESBL producer sensitive to gentamicin, imipenem, cefoxitin, and ertapenem. Blood cultures: One of two is growing klebsiella pneumonia, ESBL-producing species. Of note, the blood culture drawn from his  central line on September 12 was negative. Follow up blood culture September 14 is negative to date. White blood count currently is 6.9, hemoglobin 8.4, platelets 240. Renal function shows a creatinine of 2.12.   IMAGING: A nephrostogram/loopogram done September 12 shows good position of  nephrostomy tube.   IMPRESSION:  A 74 year old gentleman with a complicated urological history, presenting with recurrent sepsis and urinary tract infection, with an ESBL-producing klebsiella pneumonia. He has hardware in place in his left nephrostomy tube as well as a right ureteal   stent. He has been treated appropriately as an outpatient with imipenem, and had cleared his cultures. However, within several days of stopping the only antibiotic available to treat this infection, he recurred and presented again with sepsis. Given the limited availability of treatment options for this bacteria, I think the only way to clear this will be to remove all plastic and metal hardware from his urological system for a few days while giving imipenem or meropenem to try to sterilize the urine. Following sterilization, he could likely have reimplantation of the stents that he needs, and could finish a 2-week course of imipenem as an outpatient. Once he finishes that, we could follow closely to ensure that the infection does not recur.   If the hardware cannot be removed, this infection will not clear and he would need lifelong IV antibiotics, which is not a reasonable option.   I will discuss further with Dr. Achilles Dunk in the morning.  Thank you for the consult.     ____________________________ Stann Mainland. Sampson Goon, MD dpf:mr D: 12/15/2012 22:00:00 ET T: 12/15/2012 22:15:02 ET JOB#: [id number]  cc: Stann Mainland. Sampson Goon, MD, <Dictator> Rice Walsh Sampson Goon MD ELECTRONICALLY SIGNED 12/30/2012 21:31

## 2014-07-24 NOTE — Consult Note (Signed)
PATIENT NAME:  William Horn, William Horn MR#:  [id number] DATE OF BIRTH:  Jul 17, 1940  DATE OF CONSULT:  12/15/2012  CONSULTING PHYSICIAN:  Herb GraysYun Shiori Adcox, MD  REQUESTING PHYSICIAN:  Dr. Enid Baasadhika Kalisetti  REASON FOR CONSULTATION:  Bacteremia and recurrent urinary tract infections.  HISTORY OF PRESENT ILLNESS:  This is a very pleasant, 74 year old gentleman, who I had seen on his last admission when he had a urinary tract infection and bacteremia, with an ESBL-producing klebsiella. He has a history of known bladder cancer diagnosed in 2013, as well as hydronephrosis requiring nephrostomy tubes. He has a history of chronic kidney disease. At his last admission, he had internalization of his left nephrostomy tube. He was treated as an outpatient with 2 weeks of ertapenem after discharge. However, following cessation of his antibiotics, he was found to be anemic, and had a transfusion at the cancer center. Following the transfusion, he developed high spiking fevers. He came to the emergency room, where he was found to have a temperature of 101.7. White blood count was normal at 8.2. His urinalysis was 3+. He ended up growing the same ESBL-producing klebsiella species from both his urine and his blood. Of note, as an outpatient, I had seen him and repeated a urine culture while he was just finishing his ertapenem. This had cleared, with a normal urinalysis and negative cultures.   Since his admission, he has been on appropriate therapy and has responded clinically. We are consulted for further recommendations.   PAST MEDICAL HISTORY:   1.  Chronic kidney disease. 2.  Bladder cancer, status post chemotherapy. 3.  Anemia of chronic disease. 4.  Hypertension. 5.  Thrombocytopenia. 6.  Type 2 diabetes. 7.  Admission in November 2013 with sepsis and enterococcus bacteremia as well as enterococcal and pseudomonal UTI.  PAST SURGICAL HISTORY:   1.  Cystoscopy and nephroscopy with stent placement and removal in August  2014.  2.  Bilateral nephrostomy tube placement. 3.  Cataract surgery.  FAMILY HISTORY:  Father died at age 76. Mother died at age 76 of natural causes.  SOCIAL HISTORY:  He is married. He is a nonsmoker. He does not drink. He is retired from [employer]. He has 10 children.  REVIEW OF SYSTEMS:  Eleven systems reviewed and negative, except as per HPI.  ALLERGIES:  LATEX, which causes a skin rash.   MEDICATIONS:  Antibiotics since admission include meropenem 1 gram IV q. 12. Other medications include amlodipine, Zyrtec, heparin, sliding scale insulin, Tylenol. Of note, he also received a dose of vanco and Zosyn on admission.  PHYSICAL EXAMINATION: VITAL SIGNS:  T-max in the last 24 hours is 99.9, blood pressure 162/84, pulse 99, respirations 20, sat of 95% in room air. GENERAL:  This is a thin gentleman (Dictation Anomaly)<<MISSING TEXT>> appearing.  HEENT:  Pupils equal, round, reactive to light and accommodation. Extraocular movements are intact. Sclerae are anicteric. Oropharynx is clear. NECK:  Supple.  HEART:  Regular. LUNGS:  Clear to auscultation bilaterally. ABDOMEN:  Soft, nontender, nondistended. He has in his left side a nephrostomy tube with clear yellow urine.  EXTREMITIES:  No clubbing, cyanosis, or edema. NEUROLOGIC:  He is alert and oriented x 3, grossly nonfocal neuro exam.  DATA:  Urine culture on admission is growing klebsiella pneumonia, greater than 100,000 colony-forming units. It is an ESBL producer sensitive to gentamicin, imipenem, cefoxitin, and ertapenem. Blood cultures: One of two is growing klebsiella pneumonia, ESBL-producing species. Of note, the blood culture drawn from his central  line on September 12 was negative. Follow up blood culture September 14 is negative to date. White blood count currently is 6.9, hemoglobin 8.4, platelets 240. Renal function shows a creatinine of 2.12.   IMAGING: A nephrostogram/loopogram done September 12 shows good  position of nephrostomy tube.   IMPRESSION:  A 74 year old gentleman with a complicated urological history, presenting with recurrent sepsis and urinary tract infection, with an ESBL-producing klebsiella pneumonia. He has hardware in place in his left nephrostomy tube as well as a right (Dictation Anomaly)<<MISSING TEXT>>  stent. He has been treated appropriately as an outpatient with imipenem, and had cleared his cultures. However, within several days of stopping the only antibiotic available to treat this infection, he recurred and presented again with sepsis. Given the limited availability of treatment options for this bacteria, I think the only way to clear this will be to remove all plastic and metal hardware from his urological system for a few days while giving imipenem or meropenem to try to sterilize the urine. Following sterilization, he could likely have reimplantation of the stents that he needs, and could finish a 2-week course of imipenem as an outpatient. Once he finishes that, we could follow closely to ensure that the infection does not recur.   If the hardware cannot be removed, this infection will not clear and he would need lifelong IV antibiotics, which is not a reasonable option.   I will discuss further with Dr. Achilles Dunk in the morning.  Thank you for the consult.     ____________________________ Herb Grays, MD yb:mr D: 12/15/2012 22:00:45 ET T: 12/15/2012 22:15:02 ET JOB#: [id number]  cc: Herb Grays, MD, <Dictator>

## 2014-07-24 NOTE — Consult Note (Signed)
Reason for Visit: This 74 year old Male patient presents to the clinic for initial evaluation of  transitional cell carcinoma bladder .   Referred by Dr. Sherrlyn Hock.  Diagnosis:  Chief Complaint/Diagnosis   74 year old male with at least stage III (T3, N0, M0) high-grade transitional cell carcinoma bladder with significant hydronephrosis at presentation.  Pathology Report pathology report reviewed   Imaging Report CT scans reviewed, PET/CT scan reviewed   Referral Report clinical notes reviewed   Planned Treatment Regimen oncurrent chemotherapy and radiation to his pelvis.   HPI   patient is a pleasant 74 year old male who presented with creatinine in the 10 range. He was found to be in severe renal failure with hydronephrosis requiring nephrostomy tube placement. 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 After nephrostomy tubes were placed his renal function improved. He was started on weekly carboplatin and gemcitabine in November 2013 has had an excellent response. Most recently repeat cystoscopy by Dr. cope showed excellent response to chemotherapy with bilateral ureteral orifices easily visualized. Previous site of TUR was essentially healed. At this time Dr. Sherrlyn Hock asked me to evaluate the patient for possible concurrent chemoradiation Patient is doing well. He is not having significant urinary frequency or urgency at this time. Bowels are in good control. He has no other areas of pain. He does easily fatigued. Attempted is now made to remove both nephrostomy tube by Dr. cope.  Past Hx:    chemotherapy:    bladder cancer:    HTN:    Diabetes:   Past, Family and Social History:  Past Medical History positive   Cardiovascular hypertension   Genitourinary chronic renal disease last creatinine 2.5   Endocrine diabetes mellitus   Past Surgical History prior cataract  surgery   Family History noncontributory   Social History noncontributory   Additional Past Medical and Surgical History accompanied by wife today   Allergies:   No Known Allergies:   Home Meds:  Home Medications: Medication Instructions Status  sodium polystyrene sulfonate 15 g/60 mL oral suspension 60 mL orally once Active  metoprolol succinate 25 mg oral tablet, extended release 1 tab(s) orally once a day Active  ASA 81 mg   once a day  Active  glimepiride 2 mg oral tablet 0.5 tab(s) orally once a day Active   Review of Systems:  General negative   Performance Status (ECOG) 0   Skin negative   Breast negative   Ophthalmologic negative   ENMT negative   Respiratory and Thorax negative   Cardiovascular negative   Gastrointestinal negative   Genitourinary see HPI   Musculoskeletal negative   Neurological negative   Psychiatric negative   Hematology/Lymphatics negative   Endocrine negative   Allergic/Immunologic negative   Review of Systems   according to the nurse's notesPatient denies any weight loss, fatigue, weakness, fever, chills or night sweats. Patient denies any loss of vision, blurred vision. Patient denies any ringing  of the ears or hearing loss. No irregular heartbeat. Patient denies heart murmur or history of fainting. Patient denies any chest pain or pain radiating to her upper extremities. Patient denies any shortness of breath, difficulty breathing at night, cough or hemoptysis. Patient denies any swelling in the lower legs. Patient denies any nausea vomiting, vomiting of blood, or coffee ground material in the vomitus. Patient denies any stomach pain. Patient states has had normal bowel movements no significant constipation or diarrhea. Patient  denies any dysuria, hematuria or significant nocturia. Patient denies any problems walking, swelling in the joints or loss of balance. Patient denies any skin changes, loss of hair or loss of weight.  Patient denies any excessive worrying or anxiety or significant depression. Patient denies any problems with insomnia. Patient denies excessive thirst, polyuria, polydipsia. Patient denies any swollen glands, patient denies easy bruising or easy bleeding. Patient denies any recent infections, allergies or URI. Patient "s visual fields have not changed significantly in recent time.  Physical Exam:  General/Skin/HEENT:  General normal   Skin normal   Eyes normal   ENMT normal   Head and Neck normal   Additional PE well-developed thin male in NAD. Lungs are clear to A&P cardiac examination shows regular rate and rhythm. Abdomen is benign with no organomegaly or masses noted. No suprapubic tenderness is identified. Inguinal nodes are negative. No significant edema in his lower extremities is noted.   Breasts/Resp/CV/GI/GU:  Respiratory and Thorax normal   Cardiovascular normal   Gastrointestinal normal   Genitourinary normal   MS/Neuro/Psych/Lymph:  Musculoskeletal normal   Neurological normal   Lymphatics normal   Other Results:  Radiology Results: LabUnknown:    14-Jan-14 13:02, CT Abdomen and Pelvis Without Contrast  PACS Image   CT:  CT Abdomen and Pelvis Without Contrast   REASON FOR EXAM:    restaging bladder Ca pat is on chemo  COMMENTS:       PROCEDURE: CT  - CT ABDOMEN AND PELVIS W0  - Apr 16 2012  1:02PM     RESULT: Comparison: None    Technique: Multiple axial images from the lung bases to the symphysis   pubis were obtained without oral and without intravenous contrast.    Findings:  Minimal opacities in the base of the right middle lobe and lingula are   likely secondary to atelectasis or scarring.    Lack of intravenous contrast limits evaluation of the solid abdominal     organs.  Grossly, the liver, the gallbladder, adrenals, spleen, and   pancreas are unremarkable. There are bilateral nephrostomies. No   hydronephrosis. Small foci of air in the  superior collecting systems of   the bilateral kidneys is likely related to the nephrostomies.  Small   amount of air in the proximal ureters is likely related to the   nephrostomies.    No definite retroperitoneal or mesenteric lymphadenopathy, though   evaluation is limited by the adjacent decompressed loops of small bowel.   There is circumferential bladder wall thickening which is greatest along   the posterior aspect of the bladder. This is slightly decreased from   prior. Mild presacral and perirectal soft tissue thickening is decreased   from prior. The small and large bowel are normal in caliber. The appendix   is normal. Calcifications in the right and left hemipelvis may represent   phleboliths versus one or calcification in the distal ureters.   No aggressive lytic or sclerotic osseous lesions are identified.    IMPRESSION:   1. Slight interval decrease in circumferential bladder wall thickening.  2. Slight interval decrease in nonspecific presacral and perirectal soft   tissue thickening.  3. Bilateral nephrostomies, without hydronephrosis.        Verified By: Lewie Chamber, M.D., MD  Nuclear Med:    21-Oct-13 13:47, PET/CT Scan Bladder Cancer  PET/CT Scan Bladder Cancer   REASON FOR EXAM:    Large invasive bladder tumor  COMMENTS:  PROCEDURE: PET - PET/CT IMAGING BLADDER CA  - Jan 22 2012  1:47PM     RESULT: History: Bladder tumor.    Comparison Study: CT of 01/14/2012.     Finding: Following determination of fasting blood sugar of 157 mg   deciliter 12.52 mCi of F-18 FDG administered to the patient. Mild diffuse   increased activity is noted over the bladder and prostate, no intense PET   positive abnormalities identified. Bilateral nephrostomy tubes are   present. Present identified hydronephrosis has improved. CT reveals small   bilateral pleural effusions. Bladder/prostate mass is persistent an     unchanged appearance. Foley catheter is in  place.    IMPRESSION:  No focal intensely PET positive lesions identified. There is   diffuse in mild increased activity over the prostate and bladder.   Previously identified bilateral hydronephrosis has improved. Small   bilateral pleural effusions.          Verified By: Gwynn BurlyHOMAS E. 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 Based on his excellent response to chemotherapy would try to salvage his bladder with whole pelvic radiation followed by a boost to his bladder bringing up his total dose to 6500 cGy. We'll start off with whole pelvic course of radiation and I have set him up for CT simulation for that early next week. Risks and benefits of treatment including exacerbation of urinary frequency urgency and nocturia, alteration blood counts, possible diarrhea, and fatigue were all explained in detail to the patient and his wife. They both seem to comprehend my treatment plan well. I discussed the case personally with Dr. Sherrlyn HockPandit will be managing his chemotherapy.  I would like to take this opportunity to thank you for allowing me to continue to participate in this patient's care.  CC Referral:  cc: Dr. Toy CookeyErnest Eason   Electronic Signatures: Rushie Chestnuthrystal, Gordy CouncilmanGlenn S (MD)  (Signed [id number]-Feb-14 15:42)  Authored: HPI, Diagnosis, Past Hx, PFSH, Allergies, Home Meds, ROS, Physical Exam, Other Results, Relevent Results, Encounter Assessment and Plan, CC Referring Physician   Last Updated: 18-Feb-14 15:42 by Rebeca Alerthrystal, Day Deery S (MD)

## 2014-07-24 NOTE — Consult Note (Signed)
Requesting physician: Dr. Clint GuyHower Consulting physician: Dr. Lonna CobbStoioff  Reason for consultation: Sepsis from a urinary source, left nephrostomy/right ureteral stent  Chief complaint: Urinary infection  History of present illness: He has a history of high-grade, muscle invasive transitional cell carcinoma bladder and bilateral ureteral obstruction treated with chemotherapy and radiation.  He presently has a left nephrostomy tube and right ureteral stent.  He underwent an attempt at internalization of the right nephrostomy and the distal stent was coiled in the distal ureter.  On 5 August internalization was achieved by Dr. Achilles Dunkope through a percutaneous approach.  Cystoscopy was performed which showed no evidence of active tumor.  He was scheduled for an attempt at internalization of the left nephrostomy by interventional radiology today however this was canceled secondary to infection.  He presented to the emergency room complaining of fever which was noted be 101.7 in the emergency department.  He did receive a blood transfusion the a.m. on 9/11.  He has noted some increased frequency, urgency and dysuria.  He was admitted for UTI and started on broad-spectrum antibiotics.  Since his hospitalization he states he is feeling much better.  He had a temp of 101.6 this a.m.  He states his voiding pattern has improved.  Exam: Abdomen is soft, nontender.  The left nephrostomy tube is draining clear urine.  No CVA tenderness is present.  Data: Urine culture is pending.  There is one set of blood cultures growing gram-negative rods.  A nephrostogram was performed this morning.  The tube was in correct position.  There is mild left hydroureter down to the mid ureter.  Imaging of the distal ureter and bladder was not performed.  Impression: 1.  Febrile UTI/ bacteremia  2.  Bilateral hydronephrosis.  He had recent placement of a right ureteral stent and his right nephrostomy has been removed.  He was scheduled for an  attempt at internalization of his left nephrostomy tube today. 3.  Bladder cancer status post chemotherapy and radiation  Recommendation: 1.  Continue broad-spectrum antibiotics pending final culture and sensitivity.  Would not recommend any significant manipulation/internalization of his left nephrostomy until his infection has been treated.  2. There is no indication for change of his right ureteral stent as it was placed approximately 1 month ago. 3. Will not have the covering urologist round this weekend as he is stable and improving.  Please reconsult for any changes.  Electronic Signatures: Riki AltesStoioff, Byrl Latin C (MD)  (Signed on 12-Sep-14 16:51)  Authored  Last Updated: 12-Sep-14 16:51 by Riki AltesStoioff, Lailah Marcelli C (MD)

## 2014-07-24 NOTE — Consult Note (Signed)
Patient overall doing well. Afebrile/vital signs stable Good urine output Urine culture as well as blood cultures demonstrate Klebsiella. No change in renal function with unclamping right nephrostomy tube. We will plan on proceeding with right nephrostomy tube removal in the morning. Discussed with radiology. Order for removal placed. We will plan on left nephrostomy evaluation and potential stent internalization after current therapy for the infection. No other change at present. We will continue to monitor.  Electronic Signatures: Smith Robertope, Keyler Hoge S (MD)  (Signed on 21-Aug-14 16:10)  Authored  Last Updated: 21-Aug-14 16:10 by Smith Robertope, Keil Pickering S (MD)

## 2014-07-24 NOTE — Discharge Summary (Signed)
PATIENT NAME:  William Horn, [name]:  [id number] DATE OF BIRTH:  [birth year]  DATE OF ADMISSION:  11/19/2012 DATE OF DISCHARGE:  11/23/2012  PRESENTING COMPLAINT: Fever.   DISCHARGE DIAGNOSES: 1.  Klebsiella extended-spectrum beta lactamase (ESBL) sepsis.  2.  Urinary tract infection. 3.  Klebsiella urinary tract infections, recurrent.  4.  Type 2 diabetes.  5.  History of urothelial bladder cancer.  6.  Chronic left nephrostomy, drained.   PROCEDURES: Removal of right nephrostomy drain; placement of right central line.   CODE STATUS: FULL CODE.   MEDICATIONS: 1.  Aspirin 81 mg daily.  2.  Metoprolol 25 mg, 1-1/2 tablets daily.  3.  Ferrous sulfate 325 daily.  4.  Glimiperide 1 mg daily.  5.  Primaxin IV 500 mg b.i.d.   Home health services for RN for IV antibiotic treatment.   PICC line care per protocol.  Follow up with Dr. Sampson GoonFitzgerald, 1 to 2 weeks.   Follow up with Dr. Achilles Dunkope, 1 to 2 weeks.   Follow up with Dr. Glenna FellowsKruska  when seen at nephrology in 1 to 2 weeks.   LABS AT DISCHARGE: Repeat blood cultures negative. Creatinine is 2.41.   BUN is 39, sodium is 137, potassium is 3.9.   Platelet count 207.   CT of the abdomen showed bilateral nephrostomies and right ureteral stent in good position. No evidence of abnormality otherwise noted.   Blood cultures on admission positive for Klebsiella pneumoniae; ESBL.   Urine culture on admission positive for Klebsiella ESBL.   White count on admission was 10.5.   BRIEF SUMMARY OF HOSPITAL COURSE: Mr. William Horn is a very pleasant 74 year old African-American gentleman with a history of urothelial high-grade bladder cancer, causing him to have significant bilateral hydronephrosis obstruction requiring a nephrostomy drain placement. The patient was admitted with:   1.  Klebsiella ESBL sepsis with acute pyelonephritis: The patient underwent recent nephrostomy and right-sided stent re-placement. He was started on broad-spectrum  antibiotics with Zosyn, however urine culture grew ESBL-producing Klebsiella. Was changed to IV ertapenem. Infectious disease consultation was obtained with Dr. Alger SimonsSwisher, who recommended changed to IV Primaxin since the patient is not able to afford meropenem at home. He remained afebrile. Repeat cultures from 08/21 remained clear. The patient will finish up 2 weeks of IV antibiotics, with Primaxin. Follow up with Dr. Sampson GoonFitzgerald. PICC line was placed for IV antibiotics.  2.  Bilateral hydronephrosis, status post bilateral nephrostomy tube with right re-stent, August 6th: The patient was seen by Dr. Achilles Dunkope, who recommended removal of right nephrostomy tube. His left nephrostomy tube now is draining well. It is likely to come out at a later date once infection clears.  3.  High-grade urothelial bladder cancer: Getting treatment with Dr. Sherrlyn HockPandit.  4.  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 The patient came in with creatinine of 2.56, it is down to 2.3. His baseline creatinine is 1.9. The patient has had dialysis in the past. Dr. Glenna FellowsKruska, [facility] saw the patient.  5.  Anemia of chronic disease: Hemoglobin remained stable.  6.  Type 2 diabetes: Glimepiride was resumed.   Hospital stay otherwise remained stable.   THE PATIENT REMAINED A FULL CODE.   Time spent: Forty minutes.    ____________________________ Wylie HailSona A. Allena KatzPatel, MD sap:dm D: 11/24/2012 06:48:19 ET T: 11/24/2012 10:05:13 ET JOB#: [id number]  cc: Morayma Godown A. Allena KatzPatel, MD, <Dictator> Stann Mainlandavid P. Sampson GoonFitzgerald, MD Madolyn FriezeBrian S. Achilles Dunkope, MD Tyler PitaLindsay A. Kruska, MD Willow OraSONA A Clayden Withem MD ELECTRONICALLY SIGNED  12/05/2012 20:05 

## 2014-07-24 NOTE — Consult Note (Signed)
Nephrostomytube on the right removed todayline pending for IV abx.from GU standpoint.further intervention until IV abx completed.arrange OP Left sent internalization in several weeks.   Electronic Signatures: Smith Robertope, Jaylah Goodlow S (MD)  (Signed on 22-Aug-14 17:30)  Authored  Last Updated: 22-Aug-14 17:30 by Smith Robertope, Pecolia Marando S (MD)

## 2014-07-24 NOTE — Consult Note (Signed)
PATIENT NAME:  William Horn, [name] MR#:  [id number] DATE OF BIRTH:  06-Nov-1940  DATE OF CONSULTATION:  11/21/2012  REQUESTING PHYSICIAN: Dr. Allena KatzPatel CONSULTING UROLOGIST: Madolyn FriezeBrian S. Achilles Dunkope, MD  CONSULTING PHYSICIAN:  Stann Mainlandavid P. Sampson GoonFitzgerald, MD  REASON FOR CONSULTATION: ESBL producing klebsiella, bacteremia and urinary tract infection.   HISTORY OF PRESENT ILLNESS: This is a pleasant 74 year old gentleman with a known bladder cancer diagnosed in 2013 when he presented with hydronephrosis requiring nephrostomy tubes. He also has a history of chronic kidney disease. He was last admitted August 6 to August 8 when Dr. Achilles Dunkope performed right percutaneous nephrostomy and stent removal and also left. He also had a cystoscopy. During that admission he did have fevers and chills and was covered with antibiotics but no cultures were done.   The patient was readmitted August 19 after he presented with sudden onset of fevers and chills, fevers to 103. He also had some mild dysuria and darkening of his urine. He had no abdominal pain or back pain.   He was admitted through the Emergency Room where he was found to be febrile and to have positive urinalysis. Since admission, urine culture and blood cultures have grown an extended spectrum beta-lactamase producing Klebsiella pneumoniae. He has been followed by urology and their intention is to remove the nephrostomy tube tomorrow.   Currently the patient reports feeling back to his baseline. He has improved symptoms and he is making urine without dysuria.   PAST MEDICAL HISTORY:  1.  Bladder cancer diagnosed in 2013, October, when he presented with hydronephrosis. He has had a complicated urological history since then with placement of bilateral nephrostomy tubes.  2.  Stage IV chronic kidney disease.  3.  Anemia of chronic disease.  4.  Hypertension.  5.  Thrombocytopenia.  6.  Type 2 diabetes.  7.  Admission in November 2013 with sepsis, enterococcus bacteremia as well as  enterococcus and pseudomonal UTI.   PAST SURGICAL HISTORY:  1.  Cystoscopy and nephroscopy with stent removal and placement in August 2014.  2.  Bilateral nephrostomy tube placement.  3.  Cataract surgery.   FAMILY HISTORY: Father died at age 76. Mother died at 6696 of natural causes.   SOCIAL HISTORY: The patient is married. He is a nonsmoker. He does not smoke or drink. He is retired from working at [employer]. He has 10 children.   REVIEW OF SYSTEMS: Eleven systems reviewed and negative except as per HPI.   ALLERGIES: LATEX CAUSES A SKIN RASH.   ANTIBIOTICS SINCE ADMISSION:  1.  Ertapenem, begun August 21.  2.  Zosyn, begun August 18 and continued until August 20.  3.  Vancomycin. Dose was given August 19.   OTHER MEDICATIONS: Include acetaminophen, aspirin,  iron, glimepiride, subQ heparin, metoprolol.   PHYSICAL EXAMINATION:  VITAL SIGNS: T-max over the last 24 hours is 99.1. Pulse 76, blood pressure 153/76, respirations 18, saturation 99% on room air.  GENERAL: Thin, chronically ill-appearing gentleman sitting on the side of his bed.  HEENT: His pupils are equal, round and reactive to light and accommodation. Extraocular movements are intact. Sclerae anicteric. Oropharynx is clear. NECK: Supple.  HEART: Regular.  LUNGS: Clear.  ABDOMEN: Soft, nontender, nondistended. In his bilateral flanks, he has nephrostomy tubes, which are draining clear urine.  EXTREMITIES: No clubbing, cyanosis or edema.  NEUROLOGIC: He is alert and oriented x3. Grossly nonfocal neuro exam.   DATA: Current white count is 10.5 on today's admission, August 18. Renal function shows  creatinine of 2.54, BUN of 42. Urinalysis done on day of admission showed 3+  leukocyte esterase, 400 white cells, 32 red cells. Urine culture grew greater than 100,000 Klebsiella pneumoniae, which is an ESBL producer resistant to Bactrim, nitrofurantoin, cefazolin, ampicillin, ceftriaxone, and ciprofloxacin. Sensitive to  gentamicin and imipenem. Blood cultures 2 of 2 grown, done August 19 grew Klebsiella pneumoniae which is also an Insurance underwriter.   CAT scan done August 19 revealed bilateral nephrostomy tubes and right ureteral stent in good position. A small amount of air was noted in the renal collecting system. Bladder wall and rectal wall were thickened. No acute abnormalities otherwise.   IMPRESSION: A 74 year old with bladder cancer undergoing surveillance after good response to chemotherapy. He has a complicated urological system with bilateral nephrostomy tubes in place. He also has a stent in, although it seems that was removed. He is growing a very resistant organism that will be difficult to treat except with carbapenems.   RECOMMENDATIONS:  1.  I will discuss with Dr. Achilles Dunk plans for further instrumentation of the urinary system. It would be best if all hardware could be removed as it will be impossible to clear this infection or to suppress it without IV antibiotics prolonged course.  2.  For now I would continue ertapenem which this organism was sensitive to. There are no oral options for treatment of this infection.  3.  If the patient will end up with an internalized stent, it would be best if a period of time could pass with no hardware in prior to placement of that permanent or semipermanent stent. This would allow the antibiotics to hopefully eradicate the organism prior to placement of any long-term stents.  4.  If we are considering long-term IV antibiotics, he will need a PICC line.   Thank you for the consult. I will be glad to follow with you.    ____________________________ Stann Mainland. Sampson Goon, MD dpf:np D: 11/21/2012 20:57:38 ET T: 11/21/2012 21:43:13 ET JOB#: [id number]  cc: Stann Mainland. Sampson Goon, MD, <Dictator> Zaeden Lastinger Sampson Goon MD ELECTRONICALLY SIGNED 11/22/2012 19:22

## 2014-07-24 NOTE — Consult Note (Signed)
Patient seen, chart reviewed, films reviewed, note dictated. Hydronephrosis, ureteral stricture, urinary tract infection, history of bladder cancer There has been significant improvement in the hydronephrosis with placement of the stent.  The RIGHT nephrostomy tube has been clamped.  The plan was for removal in the very near future.  With his worsened renal function despite improvement in the hydronephrosis, unclamping of the nephrostomy tube and placing to drainage until antibiotic therapy is underway according to culture results.  We will then likely arrange nephrostomy tube removal on the RIGHT prior to discharge.  The LEFT will be LEFT to gravity drainage.  He has been scheduled for evaluation with possible LEFT stent internalization in the near future.  We will hold on this at present.  There is no indication for any further urological intervention at present.  Treat for the infection as you would otherwise do.  Utilize routine nephrostomy care per hospital protocol.  These orders have been reviewed with the nurse.  We will follow with you and make any further recommendations as indicated.  Please with free to contact us with any other questions.  Electronic Signatures: Smith Robertope, Merriel Zinger S (MD)  (Signed on 19-Aug-14 21:27)  Authored  Last Updated: 19-Aug-14 21:27 by Smith Robertope, Rommel Hogston S (MD)
